# Patient Record
Sex: Female | Born: 1955
Health system: Southern US, Community
[De-identification: ages and names within clinical notes are randomized; demographics above are authoritative.]

## PROBLEM LIST (undated history)

## (undated) DIAGNOSIS — H5440 Blindness, one eye, unspecified eye: Secondary | ICD-10-CM

## (undated) DIAGNOSIS — N2889 Other specified disorders of kidney and ureter: Secondary | ICD-10-CM

## (undated) DIAGNOSIS — M1990 Unspecified osteoarthritis, unspecified site: Secondary | ICD-10-CM

## (undated) DIAGNOSIS — K219 Gastro-esophageal reflux disease without esophagitis: Secondary | ICD-10-CM

## (undated) DIAGNOSIS — K760 Fatty (change of) liver, not elsewhere classified: Secondary | ICD-10-CM

## (undated) HISTORY — PX: TONSILLECTOMY: SUR1361

## (undated) HISTORY — PX: TUBAL LIGATION: SHX77

---

## 1997-11-16 ENCOUNTER — Other Ambulatory Visit: Admission: RE | Admit: 1997-11-16 | Discharge: 1997-11-16 | Payer: Self-pay | Admitting: Obstetrics & Gynecology

## 1998-12-27 ENCOUNTER — Other Ambulatory Visit: Admission: RE | Admit: 1998-12-27 | Discharge: 1998-12-27 | Payer: Self-pay | Admitting: Obstetrics and Gynecology

## 1999-01-08 ENCOUNTER — Ambulatory Visit (HOSPITAL_COMMUNITY): Admission: RE | Admit: 1999-01-08 | Discharge: 1999-01-08 | Payer: Self-pay | Admitting: Obstetrics and Gynecology

## 1999-01-08 ENCOUNTER — Encounter: Payer: Self-pay | Admitting: Obstetrics and Gynecology

## 1999-01-18 ENCOUNTER — Other Ambulatory Visit: Admission: RE | Admit: 1999-01-18 | Discharge: 1999-01-18 | Payer: Self-pay | Admitting: Obstetrics and Gynecology

## 1999-01-18 ENCOUNTER — Encounter (INDEPENDENT_AMBULATORY_CARE_PROVIDER_SITE_OTHER): Payer: Self-pay

## 2000-02-20 ENCOUNTER — Encounter: Payer: Self-pay | Admitting: Obstetrics and Gynecology

## 2000-02-20 ENCOUNTER — Ambulatory Visit (HOSPITAL_COMMUNITY): Admission: RE | Admit: 2000-02-20 | Discharge: 2000-02-20 | Payer: Self-pay | Admitting: Obstetrics and Gynecology

## 2000-04-09 ENCOUNTER — Other Ambulatory Visit: Admission: RE | Admit: 2000-04-09 | Discharge: 2000-04-09 | Payer: Self-pay | Admitting: Obstetrics and Gynecology

## 2000-09-08 ENCOUNTER — Encounter: Payer: Self-pay | Admitting: Orthopedic Surgery

## 2000-09-08 ENCOUNTER — Ambulatory Visit (HOSPITAL_COMMUNITY): Admission: RE | Admit: 2000-09-08 | Discharge: 2000-09-08 | Payer: Self-pay | Admitting: Orthopedic Surgery

## 2001-05-13 ENCOUNTER — Other Ambulatory Visit: Admission: RE | Admit: 2001-05-13 | Discharge: 2001-05-13 | Payer: Self-pay | Admitting: Obstetrics and Gynecology

## 2002-05-24 ENCOUNTER — Other Ambulatory Visit: Admission: RE | Admit: 2002-05-24 | Discharge: 2002-05-24 | Payer: Self-pay | Admitting: Obstetrics and Gynecology

## 2002-06-02 ENCOUNTER — Ambulatory Visit (HOSPITAL_COMMUNITY): Admission: RE | Admit: 2002-06-02 | Discharge: 2002-06-02 | Payer: Self-pay | Admitting: Obstetrics and Gynecology

## 2002-06-02 ENCOUNTER — Encounter: Payer: Self-pay | Admitting: Obstetrics and Gynecology

## 2002-12-23 ENCOUNTER — Encounter: Payer: Self-pay | Admitting: Emergency Medicine

## 2002-12-23 ENCOUNTER — Emergency Department (HOSPITAL_COMMUNITY): Admission: AD | Admit: 2002-12-23 | Discharge: 2002-12-24 | Payer: Self-pay

## 2002-12-24 ENCOUNTER — Encounter: Payer: Self-pay | Admitting: Emergency Medicine

## 2003-05-26 ENCOUNTER — Other Ambulatory Visit: Admission: RE | Admit: 2003-05-26 | Discharge: 2003-05-26 | Payer: Self-pay | Admitting: Obstetrics and Gynecology

## 2003-06-05 ENCOUNTER — Ambulatory Visit (HOSPITAL_COMMUNITY): Admission: RE | Admit: 2003-06-05 | Discharge: 2003-06-05 | Payer: Self-pay | Admitting: Obstetrics and Gynecology

## 2004-08-02 ENCOUNTER — Ambulatory Visit (HOSPITAL_COMMUNITY): Admission: RE | Admit: 2004-08-02 | Discharge: 2004-08-02 | Payer: Self-pay | Admitting: Obstetrics and Gynecology

## 2004-08-09 ENCOUNTER — Other Ambulatory Visit: Admission: RE | Admit: 2004-08-09 | Discharge: 2004-08-09 | Payer: Self-pay | Admitting: Obstetrics and Gynecology

## 2006-09-23 ENCOUNTER — Ambulatory Visit (HOSPITAL_COMMUNITY): Admission: RE | Admit: 2006-09-23 | Discharge: 2006-09-23 | Payer: Self-pay | Admitting: Obstetrics and Gynecology

## 2010-02-21 ENCOUNTER — Ambulatory Visit (HOSPITAL_COMMUNITY): Admission: RE | Admit: 2010-02-21 | Discharge: 2010-02-21 | Payer: Self-pay | Admitting: Family Medicine

## 2010-06-05 ENCOUNTER — Emergency Department (HOSPITAL_COMMUNITY)
Admission: EM | Admit: 2010-06-05 | Discharge: 2010-06-05 | Disposition: A | Discharge status: Home / Self Care | Payer: Medicaid Other | Attending: Emergency Medicine | Admitting: Emergency Medicine

## 2010-06-05 DIAGNOSIS — R109 Unspecified abdominal pain: Secondary | ICD-10-CM | POA: Insufficient documentation

## 2010-06-05 DIAGNOSIS — R509 Fever, unspecified: Secondary | ICD-10-CM | POA: Insufficient documentation

## 2010-06-05 DIAGNOSIS — R197 Diarrhea, unspecified: Secondary | ICD-10-CM | POA: Insufficient documentation

## 2010-06-05 DIAGNOSIS — K5900 Constipation, unspecified: Secondary | ICD-10-CM | POA: Insufficient documentation

## 2010-06-05 DIAGNOSIS — K5289 Other specified noninfective gastroenteritis and colitis: Secondary | ICD-10-CM | POA: Insufficient documentation

## 2010-06-05 DIAGNOSIS — R630 Anorexia: Secondary | ICD-10-CM | POA: Insufficient documentation

## 2010-06-05 DIAGNOSIS — R112 Nausea with vomiting, unspecified: Secondary | ICD-10-CM | POA: Insufficient documentation

## 2010-06-05 LAB — URINALYSIS, ROUTINE W REFLEX MICROSCOPIC
Bilirubin Urine: NEGATIVE
Bilirubin Urine: NEGATIVE
Hgb urine dipstick: NEGATIVE
Ketones, ur: NEGATIVE mg/dL
Ketones, ur: NEGATIVE mg/dL
Nitrite: NEGATIVE
Nitrite: NEGATIVE
Protein, ur: NEGATIVE mg/dL
Protein, ur: NEGATIVE mg/dL
Specific Gravity, Urine: 1.02 (ref 1.005–1.030)
Specific Gravity, Urine: 1.007 (ref 1.005–1.030)
Urine Glucose, Fasting: NEGATIVE mg/dL
Urobilinogen, UA: 0.2 mg/dL (ref 0.0–1.0)
Urobilinogen, UA: 0.2 mg/dL (ref 0.0–1.0)
pH: 7.5 (ref 5.0–8.0)

## 2010-06-05 LAB — URINE MICROSCOPIC-ADD ON

## 2010-06-20 ENCOUNTER — Other Ambulatory Visit: Payer: Self-pay | Admitting: Gastroenterology

## 2010-06-20 DIAGNOSIS — R194 Change in bowel habit: Secondary | ICD-10-CM

## 2010-06-26 ENCOUNTER — Ambulatory Visit
Admission: RE | Admit: 2010-06-26 | Discharge: 2010-06-26 | Disposition: A | Discharge status: Home / Self Care | Payer: No Typology Code available for payment source | Source: Ambulatory Visit | Attending: Gastroenterology | Admitting: Gastroenterology

## 2010-06-26 DIAGNOSIS — R194 Change in bowel habit: Secondary | ICD-10-CM

## 2010-06-26 MED ORDER — IOHEXOL 300 MG/ML  SOLN
100.0000 mL | Freq: Once | INTRAMUSCULAR | Status: AC | PRN
  Administered 2010-06-26: 100 mL via INTRAVENOUS

## 2010-06-28 ENCOUNTER — Other Ambulatory Visit (HOSPITAL_COMMUNITY)
Admission: RE | Admit: 2010-06-28 | Discharge: 2010-06-28 | Disposition: A | Discharge status: Home / Self Care | Payer: Self-pay | Source: Ambulatory Visit | Attending: Gastroenterology | Admitting: Gastroenterology

## 2010-06-28 ENCOUNTER — Other Ambulatory Visit: Payer: Self-pay | Admitting: Gastroenterology

## 2010-06-28 DIAGNOSIS — B379 Candidiasis, unspecified: Secondary | ICD-10-CM | POA: Insufficient documentation

## 2011-01-30 ENCOUNTER — Other Ambulatory Visit (HOSPITAL_COMMUNITY): Payer: Self-pay | Admitting: Family Medicine

## 2011-01-30 DIAGNOSIS — Z1231 Encounter for screening mammogram for malignant neoplasm of breast: Secondary | ICD-10-CM

## 2011-02-24 ENCOUNTER — Ambulatory Visit (HOSPITAL_COMMUNITY)
Admission: RE | Admit: 2011-02-24 | Discharge: 2011-02-24 | Disposition: A | Discharge status: Home / Self Care | Payer: Medicaid Other | Source: Ambulatory Visit | Attending: Family Medicine | Admitting: Family Medicine

## 2011-02-24 DIAGNOSIS — Z1231 Encounter for screening mammogram for malignant neoplasm of breast: Secondary | ICD-10-CM | POA: Insufficient documentation

## 2012-03-15 ENCOUNTER — Other Ambulatory Visit (HOSPITAL_COMMUNITY): Payer: Self-pay | Admitting: Nurse Practitioner

## 2012-03-15 DIAGNOSIS — Z1231 Encounter for screening mammogram for malignant neoplasm of breast: Secondary | ICD-10-CM

## 2012-04-06 ENCOUNTER — Ambulatory Visit (HOSPITAL_COMMUNITY)
Admission: RE | Admit: 2012-04-06 | Discharge: 2012-04-06 | Disposition: A | Discharge status: Home / Self Care | Payer: Medicaid Other | Source: Ambulatory Visit | Attending: Nurse Practitioner | Admitting: Nurse Practitioner

## 2012-04-06 DIAGNOSIS — Z1231 Encounter for screening mammogram for malignant neoplasm of breast: Secondary | ICD-10-CM | POA: Insufficient documentation

## 2013-03-14 ENCOUNTER — Other Ambulatory Visit (HOSPITAL_COMMUNITY): Payer: Self-pay | Admitting: Nurse Practitioner

## 2013-03-14 DIAGNOSIS — Z1231 Encounter for screening mammogram for malignant neoplasm of breast: Secondary | ICD-10-CM

## 2013-03-25 ENCOUNTER — Telehealth (INDEPENDENT_AMBULATORY_CARE_PROVIDER_SITE_OTHER): Payer: Self-pay

## 2013-03-25 NOTE — Telephone Encounter (Signed)
No notes in this encounter. 

## 2013-04-08 ENCOUNTER — Ambulatory Visit (HOSPITAL_COMMUNITY)
Admission: RE | Admit: 2013-04-08 | Discharge: 2013-04-08 | Disposition: A | Discharge status: Home / Self Care | Payer: Medicaid Other | Source: Ambulatory Visit | Attending: Nurse Practitioner | Admitting: Nurse Practitioner

## 2013-04-08 DIAGNOSIS — Z1231 Encounter for screening mammogram for malignant neoplasm of breast: Secondary | ICD-10-CM | POA: Insufficient documentation

## 2014-02-28 ENCOUNTER — Other Ambulatory Visit: Payer: Self-pay | Admitting: *Deleted

## 2014-03-31 ENCOUNTER — Other Ambulatory Visit (HOSPITAL_COMMUNITY): Payer: Self-pay | Admitting: Nurse Practitioner

## 2014-03-31 DIAGNOSIS — Z1231 Encounter for screening mammogram for malignant neoplasm of breast: Secondary | ICD-10-CM

## 2014-04-11 ENCOUNTER — Ambulatory Visit (HOSPITAL_COMMUNITY)
Admission: RE | Admit: 2014-04-11 | Discharge: 2014-04-11 | Disposition: A | Discharge status: Home / Self Care | Payer: Medicaid Other | Source: Ambulatory Visit | Attending: Nurse Practitioner | Admitting: Nurse Practitioner

## 2014-04-11 DIAGNOSIS — R921 Mammographic calcification found on diagnostic imaging of breast: Secondary | ICD-10-CM | POA: Insufficient documentation

## 2014-04-11 DIAGNOSIS — Z1231 Encounter for screening mammogram for malignant neoplasm of breast: Secondary | ICD-10-CM | POA: Diagnosis not present

## 2014-04-12 ENCOUNTER — Other Ambulatory Visit: Payer: Self-pay | Admitting: Nurse Practitioner

## 2014-04-12 DIAGNOSIS — R928 Other abnormal and inconclusive findings on diagnostic imaging of breast: Secondary | ICD-10-CM

## 2014-04-26 ENCOUNTER — Ambulatory Visit
Admission: RE | Admit: 2014-04-26 | Discharge: 2014-04-26 | Disposition: A | Discharge status: Home / Self Care | Payer: Medicaid Other | Source: Ambulatory Visit | Attending: Nurse Practitioner | Admitting: Nurse Practitioner

## 2014-04-26 DIAGNOSIS — R928 Other abnormal and inconclusive findings on diagnostic imaging of breast: Secondary | ICD-10-CM

## 2014-09-25 ENCOUNTER — Other Ambulatory Visit: Payer: Self-pay | Admitting: Nurse Practitioner

## 2014-09-25 DIAGNOSIS — R921 Mammographic calcification found on diagnostic imaging of breast: Secondary | ICD-10-CM

## 2014-10-26 ENCOUNTER — Ambulatory Visit
Admission: RE | Admit: 2014-10-26 | Discharge: 2014-10-26 | Disposition: A | Discharge status: Home / Self Care | Payer: Medicaid Other | Source: Ambulatory Visit | Attending: Nurse Practitioner | Admitting: Nurse Practitioner

## 2014-10-26 ENCOUNTER — Other Ambulatory Visit: Payer: Self-pay | Admitting: Nurse Practitioner

## 2014-10-26 DIAGNOSIS — N6320 Unspecified lump in the left breast, unspecified quadrant: Secondary | ICD-10-CM

## 2014-10-26 DIAGNOSIS — R921 Mammographic calcification found on diagnostic imaging of breast: Secondary | ICD-10-CM

## 2015-02-26 ENCOUNTER — Other Ambulatory Visit (HOSPITAL_COMMUNITY): Payer: Self-pay | Admitting: Gastroenterology

## 2015-02-26 DIAGNOSIS — R1084 Generalized abdominal pain: Secondary | ICD-10-CM

## 2015-03-07 ENCOUNTER — Ambulatory Visit (HOSPITAL_COMMUNITY)
Admission: RE | Admit: 2015-03-07 | Discharge: 2015-03-07 | Disposition: A | Discharge status: Home / Self Care | Payer: Medicaid Other | Source: Ambulatory Visit | Attending: Gastroenterology | Admitting: Gastroenterology

## 2015-03-07 DIAGNOSIS — R1084 Generalized abdominal pain: Secondary | ICD-10-CM | POA: Insufficient documentation

## 2015-03-07 MED ORDER — SINCALIDE 5 MCG IJ SOLR: 0.0200 ug/kg | Freq: Once | INTRAMUSCULAR | Status: DC

## 2015-03-07 MED ORDER — TECHNETIUM TC 99M MEBROFENIN IV KIT
5.5000 | PACK | Freq: Once | INTRAVENOUS | Status: DC | PRN
  Administered 2015-03-07: 6 via INTRAVENOUS
  Filled 2015-03-07: qty 6

## 2015-03-14 ENCOUNTER — Other Ambulatory Visit: Payer: Self-pay | Admitting: Surgery

## 2015-03-20 ENCOUNTER — Other Ambulatory Visit: Payer: Self-pay | Admitting: Nurse Practitioner

## 2015-03-20 DIAGNOSIS — R921 Mammographic calcification found on diagnostic imaging of breast: Secondary | ICD-10-CM

## 2015-03-29 ENCOUNTER — Ambulatory Visit
Admission: RE | Admit: 2015-03-29 | Discharge: 2015-03-29 | Disposition: A | Discharge status: Home / Self Care | Payer: Medicaid Other | Source: Ambulatory Visit | Attending: Nurse Practitioner | Admitting: Nurse Practitioner

## 2015-03-29 DIAGNOSIS — R921 Mammographic calcification found on diagnostic imaging of breast: Secondary | ICD-10-CM

## 2015-03-29 NOTE — Pre-Procedure Instructions (Signed)
    Paula AlandDiane A Owens  03/29/2015     Your procedure is scheduled on Monday, December 12.  [facility] Ad[facility] at 5:30 A.M.               Your surgery is schedule for 7:30 a.m.   Call this number if you have problems the morning of surgery:[phone]                Remember:  Do not eat food or drink liquids after midnight Sunday, December 8.  Take these medicines the morning of surgery with A SIP OF WATER -- OMEPRAZOLE, EYE DROPS (STOP ASPIRIN, HERBAL MEDICINES, NO GOODY POWDERS/ B/C POWDERS)    Do not wear jewelry, make-up or nail polish.   Do not wear lotions, powders, or perfumes.     Do not shave 48 hours prior to surgery.     Do not bring valuables to the hospital.   [facility] is not responsible for any belongings or valuables.  Contacts, dentures or bridgework may not be worn into surgery.  Leave your suitcase in the car.  After surgery it may be brought to your room.  For patients admitted to the hospital, discharge time will be determined by your treatment team.  Patients discharged the day of surgery will not be allowed to drive home.   Name and phone number of your driver:  -  Special instructions:  -   Please read over the following fact sheets that you were given. Pain Booklet, Coughing and Deep Breathing and Surgical Site Infection Prevention

## 2015-03-30 ENCOUNTER — Encounter (HOSPITAL_COMMUNITY)
Admission: RE | Admit: 2015-03-30 | Discharge: 2015-03-30 | Disposition: A | Discharge status: Home / Self Care | Payer: Medicaid Other | Source: Ambulatory Visit | Attending: Surgery | Admitting: Surgery

## 2015-03-30 ENCOUNTER — Encounter (HOSPITAL_COMMUNITY): Payer: Self-pay

## 2015-03-30 DIAGNOSIS — Z01812 Encounter for preprocedural laboratory examination: Secondary | ICD-10-CM | POA: Diagnosis not present

## 2015-03-30 DIAGNOSIS — K8050 Calculus of bile duct without cholangitis or cholecystitis without obstruction: Secondary | ICD-10-CM | POA: Diagnosis not present

## 2015-03-30 HISTORY — DX: Fatty (change of) liver, not elsewhere classified: K76.0

## 2015-03-30 HISTORY — DX: Gastro-esophageal reflux disease without esophagitis: K21.9

## 2015-03-30 HISTORY — DX: Blindness, one eye, unspecified eye: H54.40

## 2015-03-30 HISTORY — DX: Unspecified osteoarthritis, unspecified site: M19.90

## 2015-03-30 HISTORY — DX: Other specified disorders of kidney and ureter: N28.89

## 2015-03-30 LAB — COMPREHENSIVE METABOLIC PANEL
ALBUMIN: 4.2 g/dL (ref 3.5–5.0)
ALK PHOS: 83 U/L (ref 38–126)
ALT: 26 U/L (ref 14–54)
AST: 20 U/L (ref 15–41)
Anion gap: 7 (ref 5–15)
BILIRUBIN TOTAL: 1 mg/dL (ref 0.3–1.2)
BUN: 10 mg/dL (ref 6–20)
CO2: 28 mmol/L (ref 22–32)
Calcium: 10 mg/dL (ref 8.9–10.3)
Chloride: 106 mmol/L (ref 101–111)
Creatinine, Ser: 0.81 mg/dL (ref 0.44–1.00)
GFR calc Af Amer: >60 mL/min (ref >60)
GFR calc non Af Amer: >60 mL/min (ref >60)
GLUCOSE: 96 mg/dL (ref 65–99)
POTASSIUM: 4.3 mmol/L (ref 3.5–5.1)
Sodium: 141 mmol/L (ref 135–145)
TOTAL PROTEIN: 6.4 g/dL — AB (ref 6.5–8.1)

## 2015-03-30 LAB — CBC WITH DIFFERENTIAL/PLATELET
BASOS ABS: 0 10*3/uL (ref 0.0–0.1)
BASOS PCT: 1 %
Eosinophils Absolute: 0.2 10*3/uL (ref 0.0–0.7)
Eosinophils Relative: 3 %
HEMATOCRIT: 43.3 % (ref 36.0–46.0)
HEMOGLOBIN: 14.5 g/dL (ref 12.0–15.0)
Lymphocytes Relative: 38 %
Lymphs Abs: 2.4 10*3/uL (ref 0.7–4.0)
MCH: 30.9 pg (ref 26.0–34.0)
MCHC: 33.5 g/dL (ref 30.0–36.0)
MCV: 92.3 fL (ref 78.0–100.0)
Monocytes Absolute: 0.4 10*3/uL (ref 0.1–1.0)
Monocytes Relative: 7 %
NEUTROS ABS: 3.2 10*3/uL (ref 1.7–7.7)
NEUTROS PCT: 51 %
Platelets: 243 10*3/uL (ref 150–400)
RBC: 4.69 MIL/uL (ref 3.87–5.11)
RDW: 12 % (ref 11.5–15.5)
WBC: 6.2 10*3/uL (ref 4.0–10.5)

## 2015-03-30 LAB — PROTIME-INR
INR: 1.06 (ref 0.00–1.49)
Prothrombin Time: 14 seconds (ref 11.6–15.2)

## 2015-04-01 MED ORDER — CEFAZOLIN SODIUM-DEXTROSE 2-3 GM-% IV SOLR
2.0000 g | INTRAVENOUS | Status: AC
  Administered 2015-04-02: 2 g via INTRAVENOUS
  Filled 2015-04-01: qty 50

## 2015-04-01 NOTE — H&P (Signed)
Paula [place]  [facility] [facility] Patient #: [id number] DOB: [birth year] Married / Language: English / Race: White Female  History of Present Illness   Patient words: gallbladder.   The patient is a 59 year old female who presents with a complaint of possible gall bladder disease.   Her PCP is Dr. Herb Graysammy Owens.  GI physician - Dr. Roderic ScarceV. Owens.  She comes by herself. She is going to call her husband to pick her up.   Paula Owens has RUQ pain that radiates arouind to her back for the past 2 months or so. The pain does seem related to certain foods she eats. She does avoid fatty foods which she says makes her pain worse. She has had no fever, jaundice, or change in bowels. She has no history of liver, pancreas, or colon disease. She's had no prior abdominal Owens. She had an abdominal [facility] 23 January 2015 that showed a fatty liver and a left lower pole angiomyolipoma. She had a HIDA scan on 07 March 2015 that showed a 39% ejection fraction. She has seen Dr. Doy MinceVince Owens for postprandial right upper quadrant pain that radiates across her abdomen. Her lsat EGD and colonoscopy were 06/2010.  I gave copies of the US and HIDA scan to the patient.   I discussed with the patient the indications and risks of gall bladder Owens. The primary risks of gall bladder Owens include, but are not limited to, bleeding, infection, common bile duct injury, and open Owens. There is also the risk that the patient may have continued symptoms after Owens. We discussed the typical post-operative recovery course. I tried to answer the patient's questions.  I gave the patient literature about gall bladder Owens.  I told her that with a normal US and essentially normal HIDA, the chance for improvement in her symptoms probably runs 50 to 60%. She does have symptoms directly realted to eating.  Past Medical History: 1. 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 HTN 3. GERD 4. She is on disability because of her loss of vision. 5. She has some low back pain  Social History:  Married. On disability for loss of vision Has two children: 6427 and 59 yo, both live at home.   Other Problems Paula Owens; 03/14/2015 10:43 AM) Arthritis Back Pain Gastroesophageal Reflux Disease Heart murmur Hemorrhoids High blood pressure Hypercholesterolemia  Past Surgical History Paula Owens; 03/14/2015 10:43 AM) Colon Polyp Removal - Colonoscopy Oral Owens Tonsillectomy  Diagnostic Studies History Paula Owens; 03/14/2015 10:43 AM) Colonoscopy 1-5 years ago Mammogram within last year Pap Smear 1-5 years ago  Allergies Paula Owens; 03/14/2015 10:45 AM) Sulfa Antibiotics Doxycycline *DERMATOLOGICALS* Penicillin G Potassium *PENICILLINS* Erythromycin *MACROLIDES*  Medication History (Paula Owens, Owens; 03/14/2015 10:46 AM) Atorvastatin Calcium (20MG  Tablet, Oral) Active. Lisinopril (10MG  Tablet, Oral) Active. Omeprazole (40MG  Capsule DR, Oral) Active. Medications Reconciled  Social History Paula Owens; 03/14/2015 10:43 AM) Caffeine use Carbonated beverages, Tea. No alcohol use No drug use Tobacco use Never smoker.  Family History Paula Owens; 03/14/2015 10:43 AM) Alcohol Abuse Father. Arthritis Brother, Mother. Bleeding disorder Family Members In General. Breast Cancer Family Members In General. Heart Disease Mother. Respiratory Condition Family Members In General. Thyroid problems Family Members In General.  Pregnancy / Birth History Paula Owens; 03/14/2015 10:43 AM) Age  at menarche 13 years. Age of menopause [age] Contraceptive History Oral contraceptives. Gravida 3 Irregular  periods Maternal age 38-35 Para 2    Review of Systems Paula Owens; 03/14/2015 10:43 AM) General Present- Appetite Loss, Chills, Fatigue, Night Sweats and Weight Loss. Not Present- Fever and Weight Gain. Skin Not Present- Change in Wart/Mole, Dryness, Hives, Jaundice, New Lesions, Non-Healing Wounds, Rash and Ulcer. HEENT Present- Seasonal Allergies, Visual Disturbances and Wears glasses/contact lenses. Not Present- Earache, Hearing Loss, Hoarseness, Nose Bleed, Oral Ulcers, Ringing in the Ears, Sinus Pain, Sore Throat and Yellow Eyes. Respiratory Not Present- Bloody sputum, Chronic Cough, Difficulty Breathing, Snoring and Wheezing. Breast Not Present- Breast Mass, Breast Pain, Nipple Discharge and Skin Changes. Cardiovascular Not Present- Chest Pain, Difficulty Breathing Lying Down, Leg Cramps, Palpitations, Rapid Heart Rate, Shortness of Breath and Swelling of Extremities. Gastrointestinal Present- Abdominal Pain, Bloating, Gets full quickly at meals, Indigestion and Nausea. Not Present- Bloody Stool, Change in Bowel Habits, Chronic diarrhea, Constipation, Difficulty Swallowing, Excessive gas, Hemorrhoids, Rectal Pain and Vomiting. Female Genitourinary Not Present- Frequency, Nocturia, Painful Urination, Pelvic Pain and Urgency. Musculoskeletal Present- Joint Pain and Joint Stiffness. Not Present- Back Pain, Muscle Pain, Muscle Weakness and Swelling of Extremities. Neurological Present- Headaches. Not Present- Decreased Memory, Fainting, Numbness, Seizures, Tingling, Tremor, Trouble walking and Weakness. Psychiatric Present- Anxiety. Not Present- Bipolar, Change in Sleep Pattern, Depression, Fearful and Frequent crying. Endocrine Present- Cold Intolerance, Heat Intolerance and Hot flashes. Not Present- Excessive Hunger, Hair Changes and New Diabetes. Hematology Present- Easy Bruising. Not Present- Excessive bleeding, Gland problems, HIV and Persistent Infections.  Vitals (Paula Owens  Owens; 03/14/2015 10:44 AM) 03/14/2015 10:44 AM Weight: 175 lb Height: 61in Body Surface Area: 1.78 m Body Mass Index: 33.07 kg/m  Temp.: 98.74F(Temporal)  Pulse: 68 (Regular)  BP: 130/80 (Sitting, Left Arm, Standard)   Physical Exam  General: alert and generally healthy appearing. HEENT: Normal. Pupils equal.  Neck: Supple. No mass. No thyroid mass. Lymph Nodes: No supraclavicular or cervical nodes.  Lungs: Clear to auscultation and symmetric breath sounds. Heart: RRR. No murmur or rub.  Abdomen: Soft. No mass. No hernia. Normal bowel sounds. No abdominal scars.   She points to the right upper quadrant as her pain - which radiates around to her back. Rectal: Not done.  Extremities: Good strength and ROM in upper and lower extremities.  Neurologic: Grossly intact to motor and sensory function. Psychiatric: Has normal mood and affect. Behavior is normal.  Assessment & Plan: 1.  RIGHT UPPER QUADRANT ABDOMINAL PAIN (R10.11)  Impression: Probable biliary colic.  2.  BILIARY COLIC (K80.50)  Impression: I discussed gall bladder Owens with the patient.  Plan: Lap chole with IOC  3. Legally blind in left eye, threatened right eye.  Secondary to autoimmune disease (There is mention of retinitis pigmentosa in her records, but she said that she does not have that) 4. HTN 5. GERD 6. She is on disability because of her loss of vision. 7. She has some low back pain  Ovidio Kin, MD, [facility] Pager: [phone] Office phone:  [phone]

## 2015-04-02 ENCOUNTER — Ambulatory Visit (HOSPITAL_COMMUNITY): Payer: Medicaid Other | Admitting: Anesthesiology

## 2015-04-02 ENCOUNTER — Observation Stay (HOSPITAL_COMMUNITY): Payer: Medicaid Other

## 2015-04-02 ENCOUNTER — Encounter (HOSPITAL_COMMUNITY)
Admission: RE | Disposition: A | Discharge status: Home / Self Care | Payer: Self-pay | Source: Ambulatory Visit | Attending: Surgery

## 2015-04-02 ENCOUNTER — Encounter (HOSPITAL_COMMUNITY): Payer: Self-pay | Admitting: Surgery

## 2015-04-02 ENCOUNTER — Observation Stay (HOSPITAL_COMMUNITY)
Admission: RE | Admit: 2015-04-02 | Discharge: 2015-04-03 | Disposition: A | Discharge status: Home / Self Care | Payer: Medicaid Other | Source: Ambulatory Visit | Attending: Surgery | Admitting: Surgery

## 2015-04-02 DIAGNOSIS — R922 Inconclusive mammogram: Secondary | ICD-10-CM | POA: Diagnosis not present

## 2015-04-02 DIAGNOSIS — Z880 Allergy status to penicillin: Secondary | ICD-10-CM | POA: Insufficient documentation

## 2015-04-02 DIAGNOSIS — K760 Fatty (change of) liver, not elsewhere classified: Secondary | ICD-10-CM | POA: Diagnosis not present

## 2015-04-02 DIAGNOSIS — Z23 Encounter for immunization: Secondary | ICD-10-CM | POA: Diagnosis not present

## 2015-04-02 DIAGNOSIS — Z91040 Latex allergy status: Secondary | ICD-10-CM | POA: Insufficient documentation

## 2015-04-02 DIAGNOSIS — M1990 Unspecified osteoarthritis, unspecified site: Secondary | ICD-10-CM | POA: Diagnosis not present

## 2015-04-02 DIAGNOSIS — I10 Essential (primary) hypertension: Secondary | ICD-10-CM | POA: Insufficient documentation

## 2015-04-02 DIAGNOSIS — H3552 Pigmentary retinal dystrophy: Secondary | ICD-10-CM | POA: Insufficient documentation

## 2015-04-02 DIAGNOSIS — Z419 Encounter for procedure for purposes other than remedying health state, unspecified: Secondary | ICD-10-CM

## 2015-04-02 DIAGNOSIS — K219 Gastro-esophageal reflux disease without esophagitis: Secondary | ICD-10-CM | POA: Insufficient documentation

## 2015-04-02 DIAGNOSIS — M549 Dorsalgia, unspecified: Secondary | ICD-10-CM | POA: Diagnosis not present

## 2015-04-02 DIAGNOSIS — Z888 Allergy status to other drugs, medicaments and biological substances status: Secondary | ICD-10-CM | POA: Diagnosis not present

## 2015-04-02 DIAGNOSIS — Z881 Allergy status to other antibiotic agents status: Secondary | ICD-10-CM | POA: Diagnosis not present

## 2015-04-02 DIAGNOSIS — K811 Chronic cholecystitis: Principal | ICD-10-CM | POA: Insufficient documentation

## 2015-04-02 DIAGNOSIS — K8050 Calculus of bile duct without cholangitis or cholecystitis without obstruction: Secondary | ICD-10-CM

## 2015-04-02 DIAGNOSIS — R1011 Right upper quadrant pain: Secondary | ICD-10-CM | POA: Diagnosis present

## 2015-04-02 DIAGNOSIS — E7800 Pure hypercholesterolemia, unspecified: Secondary | ICD-10-CM | POA: Insufficient documentation

## 2015-04-02 DIAGNOSIS — Z882 Allergy status to sulfonamides status: Secondary | ICD-10-CM | POA: Diagnosis not present

## 2015-04-02 DIAGNOSIS — H5441 Blindness, right eye, normal vision left eye: Secondary | ICD-10-CM | POA: Insufficient documentation

## 2015-04-02 DIAGNOSIS — K829 Disease of gallbladder, unspecified: Secondary | ICD-10-CM | POA: Diagnosis present

## 2015-04-02 HISTORY — PX: CHOLECYSTECTOMY: SHX55

## 2015-04-02 SURGERY — LAPAROSCOPIC CHOLECYSTECTOMY WITH INTRAOPERATIVE CHOLANGIOGRAM
Anesthesia: General | Site: Abdomen

## 2015-04-02 MED ORDER — ONDANSETRON 4 MG PO TBDP: 4.0000 mg | ORAL_TABLET | Freq: Four times a day (QID) | ORAL | Status: DC | PRN

## 2015-04-02 MED ORDER — BUPIVACAINE HCL (PF) 0.25 % IJ SOLN
INTRAMUSCULAR | Status: AC
  Filled 2015-04-02: qty 30

## 2015-04-02 MED ORDER — ONDANSETRON HCL 4 MG/2ML IJ SOLN: 4.0000 mg | Freq: Four times a day (QID) | INTRAMUSCULAR | Status: DC | PRN

## 2015-04-02 MED ORDER — PROMETHAZINE HCL 25 MG/ML IJ SOLN
INTRAMUSCULAR | Status: AC
  Filled 2015-04-02: qty 1

## 2015-04-02 MED ORDER — CARBOXYMETHYLCELLULOSE SODIUM 0.5 % OP SOLN: 1.0000 [drp] | Freq: Three times a day (TID) | OPHTHALMIC | Status: DC | PRN

## 2015-04-02 MED ORDER — HYDROMORPHONE HCL 1 MG/ML IJ SOLN
0.2500 mg | INTRAMUSCULAR | Status: DC | PRN
  Administered 2015-04-02 (×2): 0.25 mg via INTRAVENOUS

## 2015-04-02 MED ORDER — DIPHENHYDRAMINE HCL 50 MG/ML IJ SOLN: 25.0000 mg | Freq: Four times a day (QID) | INTRAMUSCULAR | Status: DC | PRN

## 2015-04-02 MED ORDER — LIDOCAINE HCL (CARDIAC) 20 MG/ML IV SOLN
INTRAVENOUS | Status: AC
  Filled 2015-04-02: qty 5

## 2015-04-02 MED ORDER — PANTOPRAZOLE SODIUM 40 MG PO TBEC
40.0000 mg | DELAYED_RELEASE_TABLET | Freq: Every day | ORAL | Status: DC
  Administered 2015-04-03: 40 mg via ORAL
  Filled 2015-04-02: qty 1

## 2015-04-02 MED ORDER — 0.9 % SODIUM CHLORIDE (POUR BTL) OPTIME
TOPICAL | Status: DC | PRN
  Administered 2015-04-02: 1000 mL

## 2015-04-02 MED ORDER — CHLORHEXIDINE GLUCONATE 4 % EX LIQD: 1.0000 "application " | Freq: Once | CUTANEOUS | Status: DC

## 2015-04-02 MED ORDER — MORPHINE SULFATE (PF) 2 MG/ML IV SOLN
1.0000 mg | INTRAVENOUS | Status: DC | PRN
  Administered 2015-04-02 (×2): 2 mg via INTRAVENOUS
  Filled 2015-04-02 (×2): qty 1

## 2015-04-02 MED ORDER — MIDAZOLAM HCL 2 MG/2ML IJ SOLN
INTRAMUSCULAR | Status: AC
  Filled 2015-04-02: qty 2

## 2015-04-02 MED ORDER — PROMETHAZINE HCL 25 MG/ML IJ SOLN
6.2500 mg | INTRAMUSCULAR | Status: DC | PRN
  Administered 2015-04-02: 6.25 mg via INTRAVENOUS

## 2015-04-02 MED ORDER — SODIUM CHLORIDE 0.9 % IJ SOLN
INTRAMUSCULAR | Status: AC
  Filled 2015-04-02: qty 10

## 2015-04-02 MED ORDER — ROCURONIUM BROMIDE 50 MG/5ML IV SOLN
INTRAVENOUS | Status: AC
  Filled 2015-04-02: qty 1

## 2015-04-02 MED ORDER — SUGAMMADEX SODIUM 200 MG/2ML IV SOLN
INTRAVENOUS | Status: AC
  Filled 2015-04-02: qty 2

## 2015-04-02 MED ORDER — HYDROCODONE-ACETAMINOPHEN 5-325 MG PO TABS
1.0000 | ORAL_TABLET | ORAL | Status: DC | PRN
  Administered 2015-04-02 – 2015-04-03 (×2): 2 via ORAL
  Filled 2015-04-02 (×2): qty 2

## 2015-04-02 MED ORDER — IBUPROFEN 600 MG PO TABS: 600.0000 mg | ORAL_TABLET | Freq: Four times a day (QID) | ORAL | Status: DC | PRN

## 2015-04-02 MED ORDER — PHENYLEPHRINE 40 MCG/ML (10ML) SYRINGE FOR IV PUSH (FOR BLOOD PRESSURE SUPPORT)
PREFILLED_SYRINGE | INTRAVENOUS | Status: AC
  Filled 2015-04-02: qty 10

## 2015-04-02 MED ORDER — ONDANSETRON HCL 4 MG/2ML IJ SOLN
INTRAMUSCULAR | Status: AC
  Filled 2015-04-02: qty 2

## 2015-04-02 MED ORDER — SODIUM CHLORIDE 0.9 % IV SOLN
INTRAVENOUS | Status: DC | PRN
  Administered 2015-04-02: 11 mL
  Administered 2015-04-02 (×2)

## 2015-04-02 MED ORDER — EPHEDRINE SULFATE 50 MG/ML IJ SOLN
INTRAMUSCULAR | Status: AC
  Filled 2015-04-02: qty 1

## 2015-04-02 MED ORDER — DEXAMETHASONE SODIUM PHOSPHATE 10 MG/ML IJ SOLN
INTRAMUSCULAR | Status: DC | PRN
  Administered 2015-04-02: 10 mg via INTRAVENOUS

## 2015-04-02 MED ORDER — FENTANYL CITRATE (PF) 100 MCG/2ML IJ SOLN
INTRAMUSCULAR | Status: DC | PRN
  Administered 2015-04-02: 100 ug via INTRAVENOUS
  Administered 2015-04-02 (×2): 50 ug via INTRAVENOUS

## 2015-04-02 MED ORDER — DIPHENHYDRAMINE HCL 25 MG PO CAPS: 25.0000 mg | ORAL_CAPSULE | Freq: Four times a day (QID) | ORAL | Status: DC | PRN

## 2015-04-02 MED ORDER — SUCCINYLCHOLINE CHLORIDE 20 MG/ML IJ SOLN
INTRAMUSCULAR | Status: AC
  Filled 2015-04-02: qty 1

## 2015-04-02 MED ORDER — SODIUM CHLORIDE 0.9 % IR SOLN
Status: DC | PRN
  Administered 2015-04-02: 1000 mL

## 2015-04-02 MED ORDER — LIDOCAINE HCL (CARDIAC) 20 MG/ML IV SOLN
INTRAVENOUS | Status: DC | PRN
  Administered 2015-04-02: 80 mg via INTRAVENOUS

## 2015-04-02 MED ORDER — HEPARIN SODIUM (PORCINE) 5000 UNIT/ML IJ SOLN
5000.0000 [IU] | Freq: Three times a day (TID) | INTRAMUSCULAR | Status: DC
  Administered 2015-04-02 – 2015-04-03 (×3): 5000 [IU] via SUBCUTANEOUS
  Filled 2015-04-02 (×3): qty 1

## 2015-04-02 MED ORDER — KETOROLAC TROMETHAMINE 30 MG/ML IJ SOLN
INTRAMUSCULAR | Status: AC
  Filled 2015-04-02: qty 1

## 2015-04-02 MED ORDER — INFLUENZA VAC SPLIT QUAD 0.5 ML IM SUSY
0.5000 mL | PREFILLED_SYRINGE | INTRAMUSCULAR | Status: AC
Start: 2015-04-03 — End: 2015-04-03
  Administered 2015-04-03: 0.5 mL via INTRAMUSCULAR
  Filled 2015-04-02: qty 0.5

## 2015-04-02 MED ORDER — MIDAZOLAM HCL 5 MG/5ML IJ SOLN
INTRAMUSCULAR | Status: DC | PRN
  Administered 2015-04-02: 2 mg via INTRAVENOUS

## 2015-04-02 MED ORDER — LACTATED RINGERS IV SOLN
INTRAVENOUS | Status: DC | PRN
  Administered 2015-04-02 (×2): via INTRAVENOUS

## 2015-04-02 MED ORDER — POLYVINYL ALCOHOL 1.4 % OP SOLN
1.0000 [drp] | Freq: Three times a day (TID) | OPHTHALMIC | Status: DC | PRN
  Filled 2015-04-02: qty 15

## 2015-04-02 MED ORDER — ONDANSETRON HCL 4 MG/2ML IJ SOLN
INTRAMUSCULAR | Status: DC | PRN
  Administered 2015-04-02: 4 mg via INTRAVENOUS

## 2015-04-02 MED ORDER — KETOROLAC TROMETHAMINE 30 MG/ML IJ SOLN
30.0000 mg | Freq: Once | INTRAMUSCULAR | Status: AC
  Administered 2015-04-02: 30 mg via INTRAVENOUS

## 2015-04-02 MED ORDER — FENTANYL CITRATE (PF) 250 MCG/5ML IJ SOLN
INTRAMUSCULAR | Status: AC
  Filled 2015-04-02: qty 5

## 2015-04-02 MED ORDER — PROPOFOL 10 MG/ML IV BOLUS
INTRAVENOUS | Status: DC | PRN
  Administered 2015-04-02: 150 mg via INTRAVENOUS

## 2015-04-02 MED ORDER — ROCURONIUM BROMIDE 100 MG/10ML IV SOLN
INTRAVENOUS | Status: DC | PRN
  Administered 2015-04-02: 40 mg via INTRAVENOUS

## 2015-04-02 MED ORDER — HYDROMORPHONE HCL 1 MG/ML IJ SOLN
INTRAMUSCULAR | Status: AC
  Filled 2015-04-02: qty 1

## 2015-04-02 MED ORDER — BUPIVACAINE-EPINEPHRINE (PF) 0.25% -1:200000 IJ SOLN
INTRAMUSCULAR | Status: AC
  Filled 2015-04-02: qty 30

## 2015-04-02 MED ORDER — BUPIVACAINE HCL 0.25 % IJ SOLN
INTRAMUSCULAR | Status: DC | PRN
  Administered 2015-04-02: 30 mL

## 2015-04-02 MED ORDER — PROPOFOL 10 MG/ML IV BOLUS
INTRAVENOUS | Status: AC
  Filled 2015-04-02: qty 20

## 2015-04-02 MED ORDER — SUGAMMADEX SODIUM 200 MG/2ML IV SOLN
INTRAVENOUS | Status: DC | PRN
  Administered 2015-04-02: 200 mg via INTRAVENOUS

## 2015-04-02 MED ORDER — KCL IN DEXTROSE-NACL 20-5-0.45 MEQ/L-%-% IV SOLN
INTRAVENOUS | Status: DC
  Administered 2015-04-02 – 2015-04-03 (×2): via INTRAVENOUS
  Filled 2015-04-02 (×2): qty 1000

## 2015-04-02 SURGICAL SUPPLY — 50 items
APPLIER CLIP 5 13 M/L LIGAMAX5 (MISCELLANEOUS) ×2
APPLIER CLIP ROT 10 11.4 M/L (STAPLE)
APR CLP MED LRG 11.4X10 (STAPLE)
APR CLP MED LRG 5 ANG JAW (MISCELLANEOUS) ×1
BAG SPEC RTRVL LRG 6X4 10 (ENDOMECHANICALS) ×1
BLADE SURG ROTATE 9660 (MISCELLANEOUS) IMPLANT
CANISTER SUCTION 2500CC (MISCELLANEOUS) ×2 IMPLANT
CHLORAPREP W/TINT 26ML (MISCELLANEOUS) ×2 IMPLANT
CHOLANGIOGRAM CATH TAUT (CATHETERS) ×2 IMPLANT
CLIP APPLIE 5 13 M/L LIGAMAX5 (MISCELLANEOUS) IMPLANT
CLIP APPLIE ROT 10 11.4 M/L (STAPLE) IMPLANT
COVER MAYO STAND STRL (DRAPES) ×2 IMPLANT
COVER SURGICAL LIGHT HANDLE (MISCELLANEOUS) ×2 IMPLANT
DRAPE C-ARM 42X72 X-RAY (DRAPES) ×2 IMPLANT
DRAPE LAPAROSCOPIC ABDOMINAL (DRAPES) ×1 IMPLANT
ELECT REM PT RETURN 9FT ADLT (ELECTROSURGICAL) ×2
ELECTRODE REM PT RTRN 9FT ADLT (ELECTROSURGICAL) ×1 IMPLANT
FILTER SMOKE EVAC LAPAROSHD (FILTER) ×2 IMPLANT
GLOVE BIOGEL PI IND STRL 7.0 (GLOVE) IMPLANT
GLOVE BIOGEL PI INDICATOR 7.0 (GLOVE) ×1
GLOVE SURG SIGNA 7.5 PF LTX (GLOVE) ×2 IMPLANT
GLOVE SURG SS PI 7.0 STRL IVOR (GLOVE) ×1 IMPLANT
GLOVE SURG SS PI 7.5 STRL IVOR (GLOVE) ×2 IMPLANT
GOWN STRL REUS W/ TWL LRG LVL3 (GOWN DISPOSABLE) ×2 IMPLANT
GOWN STRL REUS W/ TWL XL LVL3 (GOWN DISPOSABLE) ×1 IMPLANT
GOWN STRL REUS W/TWL LRG LVL3 (GOWN DISPOSABLE) ×4
GOWN STRL REUS W/TWL XL LVL3 (GOWN DISPOSABLE) ×2
GOWN STRL REUS W/TWL XL LVL4 (GOWN DISPOSABLE) ×1 IMPLANT
IV CATH 14GX2 1/4 (CATHETERS) ×2 IMPLANT
KIT BASIN OR (CUSTOM PROCEDURE TRAY) ×2 IMPLANT
KIT ROOM TURNOVER OR (KITS) ×2 IMPLANT
LIQUID BAND (GAUZE/BANDAGES/DRESSINGS) ×2 IMPLANT
NS IRRIG 1000ML POUR BTL (IV SOLUTION) ×2 IMPLANT
PAD ARMBOARD 7.5X6 YLW CONV (MISCELLANEOUS) ×2 IMPLANT
PENCIL BUTTON HOLSTER BLD 10FT (ELECTRODE) ×1 IMPLANT
POUCH SPECIMEN RETRIEVAL 10MM (ENDOMECHANICALS) ×2 IMPLANT
SCISSORS LAP 5X35 DISP (ENDOMECHANICALS) ×2 IMPLANT
SET IRRIG TUBING LAPAROSCOPIC (IRRIGATION / IRRIGATOR) ×2 IMPLANT
SLEEVE ENDOPATH XCEL 5M (ENDOMECHANICALS) ×3 IMPLANT
SPECIMEN JAR SMALL (MISCELLANEOUS) ×2 IMPLANT
STOPCOCK 4 WAY LG BORE MALE ST (IV SETS) ×2 IMPLANT
SUT MON AB 5-0 PS2 18 (SUTURE) ×2 IMPLANT
TOWEL OR 17X24 6PK STRL BLUE (TOWEL DISPOSABLE) ×2 IMPLANT
TOWEL OR 17X26 10 PK STRL BLUE (TOWEL DISPOSABLE) ×2 IMPLANT
TRAY LAPAROSCOPIC MC (CUSTOM PROCEDURE TRAY) ×2 IMPLANT
TROCAR XCEL BLUNT TIP 100MML (ENDOMECHANICALS) ×2 IMPLANT
TROCAR XCEL NON-BLD 11X100MML (ENDOMECHANICALS) IMPLANT
TROCAR XCEL NON-BLD 5MMX100MML (ENDOMECHANICALS) ×2 IMPLANT
TUBING EXTENTION W/L.L. (IV SETS) ×2 IMPLANT
TUBING INSUFFLATION (TUBING) ×2 IMPLANT

## 2015-04-02 NOTE — Progress Notes (Signed)
Report given to lauren rn as caregiver 

## 2015-04-02 NOTE — Progress Notes (Signed)
Pt admitted to 6N05 via bed from PACU.  Pt AAO x4.  Pt on RA.  Pt has 20G to lt hand with fluids infusing.  Pt has four abdominal lap sites with skin glue.  SCDs in place.  Pt has no questions at the moment.  Report rcvd from Lauren.  Will continue to monitor.

## 2015-04-02 NOTE — Op Note (Signed)
04/02/2015  8:38 AM  PATIENT:  Paula Owens, 59 y.o., female, MRN: [id number]  PREOP DIAGNOSIS:  Biliary Colic  POSTOP DIAGNOSIS:   Chronic cholecystitis, biliary colic  PROCEDURE:   Procedure(s): LAPAROSCOPIC CHOLECYSTECTOMY WITH INTRAOPERATIVE CHOLANGIOGRAM  SURGEON:   Ovidio Kinavid Cassius Cullinane, M.D.  ASSISTANT:   A. Derrell Lollingamirez, MD  ANESTHESIA:   general  Anesthesiologist: Eilene GhaziGeorge Rose, MD CRNA: Rogelia Bogahomas P Mueller, CRNA  General  ASA: 2  EBL:  minimal  ml  BLOOD ADMINISTERED: none  DRAINS: none   LOCAL MEDICATIONS USED:   30 cc 1/4% marcaine  SPECIMEN:   Gall bladder  COUNTS CORRECT:  YES  INDICATIONS FOR PROCEDURE:  Paula Owens is a 59 y.o. (DOB: [birth year]) white  female whose primary care physician is Herb GraysSPEAR, TAMMY, MD and comes for cholecystectomy.   The indications and risks of the gall bladder surgery were explained to the patient.  The risks include, but are not limited to, infection, bleeding, common bile duct injury and open surgery.  Her symptoms are biliary in nature, so I think that she is having biliary colic, but she understands that the surgery may not fix her symptoms.  SURGERY:  The patient was taken to room [facility] at [facility].  The abdomen was prepped with chloroprep.  The patient was given 2 gm Ancef at the beginning of the operation.   A time out was held and the surgical checklist run.   An infraumbilical incision was made into the abdominal cavity.  A 12 mm Hasson trocar was inserted into the abdominal cavity through the infraumbilical incision and secured with a 0 Vicryl suture.  Three additional trocars were inserted: a 5 mm trocar in the sub-xiphoid location, a 5 mm trocar in the right mid subcostal area, and a 5 mm trocar in the right lateral subcostal area.   The abdomen was explored and the liver, stomach, and bowel that could be seen were unremarkable.   The gall bladder had adhesions along its anterior surface, consistent with chronic gall bladder  disease.  The gall bladder was identified and rotated cephalad.  Disssection was carried down to the gall bladder/cystic duct junction and the cystic duct isolated.  A clip was placed on the gall bladder side of the cystic duct.   An intra-operative cholangiogram was shot.   The intra-operative cholangiogram was shot using a cut off Taut catheter placed through a 14 gauge angiocath in the RUQ.  The Taut catheter was inserted in the cut cystic duct and secured with an endoclip.  A cholangiogram was shot with 10 cc of 1/2 strength Omnipaque.  Using fluoroscopy, the cholangiogram showed the flow of contrast into the common bile duct, up the hepatic radicals, and into the duodenum.  There was no mass or obstruction.  This was a normal intra-operative cholangiogram.   The Taut catheter was removed.  The cystic duct was tripley endoclipped and the cystic artery was identified and clipped.  The gall bladder was bluntly and sharpley dissected from the gall bladder bed.   After the gall bladder was removed from the liver, the gall bladder bed and Triangle of [place]ot were inspected.  There was no bleeding or bile leak.  The gall bladder was placed in a endocatch bag and delivered through the umbilicus.  The abdomen was irrigated with 500 cc saline.   The trocars were then removed.  I infiltrated 30 cc of 1/4% Marcaine into the incisions.  The umbilical port closed with a 0 Vicryl suture  and the skin closed with 5-0 Monocryl.  The skin was painted with LiquidBand.  The patient's sponge and needle count were correct.  The patient was transported to the RR in good condition.  Ovidio Kin, MD, [facility] Pager: [phone] Office phone:  [phone]

## 2015-04-02 NOTE — Anesthesia Postprocedure Evaluation (Signed)
Anesthesia Post Note  Patient: Paula Owens  Procedure(s) Performed: Procedure(s) (LRB): LAPAROSCOPIC CHOLECYSTECTOMY WITH INTRAOPERATIVE CHOLANGIOGRAM (N/A)  Patient location during evaluation: PACU Anesthesia Type: General Level of consciousness: awake and alert Pain management: pain level controlled Vital Signs Assessment: post-procedure vital signs reviewed and stable Respiratory status: spontaneous breathing and respiratory function stable Cardiovascular status: blood pressure returned to baseline and stable Postop Assessment: no signs of nausea or vomiting Anesthetic complications: no    Last Vitals:  Filed Vitals:   04/02/15 0915 04/02/15 0930  BP: 124/71 108/64  Pulse: 72   Temp:    Resp: 18     Last Pain:  Filed Vitals:   04/02/15 0933  PainSc: 5                  Bonne Whack S

## 2015-04-02 NOTE — Anesthesia Procedure Notes (Signed)
Procedure Name: Intubation Date/Time: 04/02/2015 7:35 AM Performed by: Rogelia BogaMUELLER, Almer Bushey P Pre-anesthesia Checklist: Patient identified, Emergency Drugs available, Suction available, Patient being monitored and Timeout performed Patient Re-evaluated:Patient Re-evaluated prior to inductionOxygen Delivery Method: Circle system utilized Preoxygenation: Pre-oxygenation with 100% oxygen Intubation Type: IV induction Ventilation: Mask ventilation without difficulty Laryngoscope Size: Mac and 4 Grade View: Grade II Tube type: Oral Tube size: 7.5 mm Number of attempts: 1 Airway Equipment and Method: Stylet Placement Confirmation: ETT inserted through vocal cords under direct vision,  positive ETCO2 and breath sounds checked- equal and bilateral Secured at: 21 cm Tube secured with: Tape Dental Injury: Teeth and Oropharynx as per pre-operative assessment

## 2015-04-02 NOTE — Anesthesia Preprocedure Evaluation (Addendum)
Anesthesia Evaluation  Patient identified by MRN, date of birth, ID band Patient awake    Reviewed: Allergy & Precautions, NPO status , Patient's Chart, lab work & pertinent test results  Airway Mallampati: II  TM Distance: >3 FB Neck ROM: Full    Dental no notable dental hx. (+) Teeth Intact, Dental Advisory Given   Pulmonary neg pulmonary ROS,    Pulmonary exam normal breath sounds clear to auscultation       Cardiovascular negative cardio ROS Normal cardiovascular exam Rhythm:Regular Rate:Normal     Neuro/Psych negative neurological ROS  negative psych ROS   GI/Hepatic Neg liver ROS, GERD  Medicated and Controlled,  Endo/Other  negative endocrine ROS  Renal/GU negative Renal ROS  negative genitourinary   Musculoskeletal negative musculoskeletal ROS (+) Arthritis , Osteoarthritis,    Abdominal   Peds negative pediatric ROS (+)  Hematology negative hematology ROS (+)   Anesthesia Other Findings   Reproductive/Obstetrics negative OB ROS                            Anesthesia Physical Anesthesia Plan  ASA: II  Anesthesia Plan: General   Post-op Pain Management:    Induction: Intravenous  Airway Management Planned: Oral ETT  Additional Equipment:   Intra-op Plan:   Post-operative Plan: Extubation in OR  Informed Consent: I have reviewed the patients History and Physical, chart, labs and discussed the procedure including the risks, benefits and alternatives for the proposed anesthesia with the patient or authorized representative who has indicated his/her understanding and acceptance.   Dental advisory given  Plan Discussed with: CRNA, Surgeon and Anesthesiologist  Anesthesia Plan Comments:        Anesthesia Quick Evaluation

## 2015-04-02 NOTE — Interval H&P Note (Signed)
History and Physical Interval Note:  04/02/2015 7:16 AM  Paula Owens  has presented today for surgery, with the diagnosis of Biliany Colic  The various methods of treatment have been discussed with the patient and family.  Daughter, [name], in room with patient.  After consideration of risks, benefits and other options for treatment, the patient has consented to  Procedure(s): LAPAROSCOPIC CHOLECYSTECTOMY WITH INTRAOPERATIVE CHOLANGIOGRAM (N/A) as a surgical intervention .  The patient's history has been reviewed, patient examined, no change in status, stable for surgery.  I have reviewed the patient's chart and labs.  Questions were answered to the patient's satisfaction.     Jailin Manocchio H

## 2015-04-02 NOTE — Transfer of Care (Signed)
Immediate Anesthesia Transfer of Care Note  Patient: Paula Owens  Procedure(s) Performed: Procedure(s): LAPAROSCOPIC CHOLECYSTECTOMY WITH INTRAOPERATIVE CHOLANGIOGRAM (N/A)  Patient Location: PACU  Anesthesia Type:General  Level of Consciousness: awake, alert , oriented and patient cooperative  Airway & Oxygen Therapy: Patient Spontanous Breathing and Patient connected to nasal cannula oxygen  Post-op Assessment: Report given to RN, Post -op Vital signs reviewed and stable and Patient moving all extremities X 4  Post vital signs: Reviewed and stable  Last Vitals:  Filed Vitals:   04/02/15 0628  BP: 144/78  Pulse: 71  Temp: 36.4 C  Resp: 20    Complications: No apparent anesthesia complications

## 2015-04-03 ENCOUNTER — Encounter (HOSPITAL_COMMUNITY): Payer: Self-pay | Admitting: Surgery

## 2015-04-03 DIAGNOSIS — K811 Chronic cholecystitis: Secondary | ICD-10-CM | POA: Diagnosis not present

## 2015-04-03 MED ORDER — HYDROCODONE-ACETAMINOPHEN 5-325 MG PO TABS: 1.0000 | ORAL_TABLET | Freq: Four times a day (QID) | ORAL | Status: DC | PRN

## 2015-04-03 NOTE — Discharge Instructions (Signed)
[facility] - DISCHARGE INSTRUCTIONS TO PATIENT  Activity:  Lifting - No lifting more than 15 pounds for 1 week, then no limit  Wound Care:   May shower starting tomorrow  Diet:  As tolerated  Follow up appointment:  Call Dr. Allene PyoNewman's office [facility] at [phone] for an appointment in 2 to 4 weeks.  Medications and dosages:  Resume your home medications.  You have a prescription for:  Vicodin  Call Dr. Ezzard StandingNewman or his office  [phone]) if you have:  Temperature greater than 100.4,  Persistent nausea and vomiting,  Severe uncontrolled pain,  Redness, tenderness, or signs of infection (pain, swelling, redness, odor or green/yellow discharge around the site),  Difficulty breathing, headache or visual disturbances,  Any other questions or concerns you may have after discharge.  In an emergency, call 911 or go to an Emergency Department at a nearby hospital.

## 2015-04-03 NOTE — Progress Notes (Signed)
AVS given. Understanding demonstrated. IV removed. Transportation arranged by patient. Flu vacc given prior to DC in R arm.

## 2015-04-03 NOTE — Discharge Summary (Signed)
Physician Discharge Summary  Patient ID:  Paula Owens  MRN: [id number]  DOB/AGE: [birth year] 59 y.o.  Admit date: 04/02/2015 Discharge date: 04/03/2015  Discharge Diagnoses:  1. RIGHT UPPER QUADRANT ABDOMINAL PAIN (R10.11)  2. BILIARY COLIC (K80.50) 3. 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 HTN 5. GERD 6. She is on disability because of her loss of vision. 7. She has some low back pain   Active Problems:   Gall bladder disease   Operation: Procedure(s): LAPAROSCOPIC CHOLECYSTECTOMY WITH INTRAOPERATIVE CHOLANGIOGRAM on 04/02/2015 - D. Ezzard Standing  Discharged Condition: good  Hospital Course: [name] is an 59 y.o. female whose primary care physician is ANDERSON,TERESA, FNP and who was admitted 04/02/2015 with a chief complaint of biliary colic.   She was brought to the operating room on 04/02/2015 and underwent  LAPAROSCOPIC CHOLECYSTECTOMY WITH INTRAOPERATIVE CHOLANGIOGRAM.  She is now one day post op.  She is doing well, taking po's, and is ready to go home.  The discharge instructions were reviewed with the patient.  Consults: None  Significant Diagnostic Studies: Results for orders placed or performed during the hospital encounter of 03/30/15  CBC WITH DIFFERENTIAL  Result Value Ref Range   WBC 6.2 4.0 - 10.5 K/uL   RBC 4.69 3.87 - 5.11 MIL/uL   Hemoglobin 14.5 12.0 - 15.0 g/dL   HCT 21.3 08.6 - 57.8 %   MCV 92.3 78.0 - 100.0 fL   MCH 30.9 26.0 - 34.0 pg   MCHC 33.5 30.0 - 36.0 g/dL   RDW 46.9 62.9 - 52.8 %   Platelets 243 150 - 400 K/uL   Neutrophils Relative % 51 %   Neutro Abs 3.2 1.7 - 7.7 K/uL   Lymphocytes Relative 38 %   Lymphs Abs 2.4 0.7 - 4.0 K/uL   Monocytes Relative 7 %   Monocytes Absolute 0.4 0.1 - 1.0 K/uL   Eosinophils Relative 3 %   Eosinophils Absolute 0.2 0.0 - 0.7 K/uL    Basophils Relative 1 %   Basophils Absolute 0.0 0.0 - 0.1 K/uL  Comprehensive metabolic panel  Result Value Ref Range   Sodium 141 135 - 145 mmol/L   Potassium 4.3 3.5 - 5.1 mmol/L   Chloride 106 101 - 111 mmol/L   CO2 28 22 - 32 mmol/L   Glucose, Bld 96 65 - 99 mg/dL   BUN 10 6 - 20 mg/dL   Creatinine, Ser 4.13 0.44 - 1.00 mg/dL   Calcium 24.4 8.9 - 01.0 mg/dL   Total Protein 6.4 (L) 6.5 - 8.1 g/dL   Albumin 4.2 3.5 - 5.0 g/dL   AST 20 15 - 41 U/L   ALT 26 14 - 54 U/L   Alkaline Phosphatase 83 38 - 126 U/L   Total Bilirubin 1.0 0.3 - 1.2 mg/dL   GFR calc non Af Amer >60 >60 mL/min   GFR calc Af Amer >60 >60 mL/min   Anion gap 7 5 - 15  Protime-INR  Result Value Ref Range   Prothrombin Time 14.0 11.6 - 15.2 seconds   INR 1.06 0.00 - 1.49    Dg Cholangiogram Operative  04/02/2015  CLINICAL DATA:  Laparoscopic cholecystectomy. Intraoperative cholangiogram. EXAM: INTRAOPERATIVE CHOLANGIOGRAM FLUOROSCOPY TIME:  10 seconds COMPARISON:  Nuclear medicine HIDA scan - 03/07/2015; CT abdomen pelvis - 06/26/2010 FINDINGS: Intraoperative cholangiographic images of the right upper abdominal  quadrant during laparoscopic cholecystectomy are provided for review. Surgical clips overlie the expected location of the gallbladder fossa. Contrast injection demonstrates selective cannulation of the central aspect of the cystic duct. There is passage of contrast through the central aspect of the cystic duct with filling of a non dilated common bile duct. There is passage of contrast though the CBD and into the descending portion of the duodenum. There is minimal reflux of injected contrast into the common hepatic duct and central aspect of the non dilated intrahepatic biliary system. There are no discrete filling defects within the opacified portions of the biliary system to suggest the presence of choledocholithiasis. IMPRESSION: No evidence of choledocholithiasis. Electronically Signed   By: Simonne Come M.D.    On: 04/02/2015 11:34   Nm Hepato W/eject Fract  03/07/2015  CLINICAL DATA:  Generalized abdominal pain for 1-2 months EXAM: NUCLEAR MEDICINE HEPATOBILIARY IMAGING WITH GALLBLADDER EF TECHNIQUE: Sequential images of the abdomen were obtained out to 60 minutes following intravenous administration of radiopharmaceutical. After slow intravenous infusion of 1.6 micrograms Cholecystokinin, gallbladder ejection fraction was determined. RADIOPHARMACEUTICALS:  5.5 mCi Tc-104m Choletec IV COMPARISON:  None FINDINGS: Normal tracer extraction from bloodstream indicating normal hepatocellular function. Normal excretion of tracer into biliary tree. Gallbladder visualized at 33 min. Small bowel visualized at 11 min. No hepatic retention of tracer. Subjectively normal emptying of tracer from gallbladder following CCK administration. Calculated gallbladder ejection fraction is 39%, low normal. Patient reported no symptoms following CCK administration. IMPRESSION: Patent biliary tree. Low normal gallbladder response to CCK stimulation with a calculated gallbladder ejection fraction of 39%. Electronically Signed   By: Ulyses Southward M.D.   On: 03/07/2015 10:52   Mm Digital Diagnostic Unilat R  03/29/2015  CLINICAL DATA:  Six-month follow-up for probably benign right breast calcifications. EXAM: DIGITAL DIAGNOSTIC RIGHT MAMMOGRAM WITH CAD COMPARISON:  Previous exam(s). ACR Breast Density Category b: There are scattered areas of fibroglandular density. FINDINGS: No suspicious masses or calcifications are seen in either breast. Spot compression magnification views were performed over the central to slightly lower right breast demonstrating continued evolution of benign appearing rim forming calcifications. There is no mammographic evidence of malignancy in the right breast. Mammographic images were processed with CAD. IMPRESSION: 1. Benign rim forming right breast calcifications. 2.  No mammographic evidence of malignancy in the  right breast. RECOMMENDATION: Recommend annual routine screening mammography, for which the patient will be due July 2017. I have discussed the findings and recommendations with the patient. Results were also provided in writing at the conclusion of the visit. If applicable, a reminder letter will be sent to the patient regarding the next appointment. BI-RADS CATEGORY  2: Benign. Electronically Signed   By: Edwin Cap M.D.   On: 03/29/2015 08:23    Discharge Exam:  Filed Vitals:   04/03/15 0121 04/03/15 0526  BP: 99/56 105/51  Pulse: 75 67  Temp: 98.6 F (37 C) 98.2 F (36.8 C)  Resp: 17 17    General: WN WF who is alert. Lungs: Clear to auscultation and symmetric breath sounds. Heart:  RRR. No murmur or rub. Abdomen: Soft. No mass.  No hernia. Normal bowel sounds. Incisions look good.  Discharge Medications:     Medication List    TAKE these medications        atorvastatin 40 MG tablet  Commonly known as:  LIPITOR  Take 40 mg by mouth daily.     carboxymethylcellulose 0.5 % Soln  Commonly known as:  REFRESH  PLUS  Place 1 drop into both eyes 3 (three) times daily as needed.     HYDROcodone-acetaminophen 5-325 MG tablet  Commonly known as:  NORCO/VICODIN  Take 1-2 tablets by mouth every 6 (six) hours as needed for moderate pain.     omeprazole 40 MG capsule  Commonly known as:  PRILOSEC  Take 40 mg by mouth daily.     ondansetron 4 MG disintegrating tablet  Commonly known as:  ZOFRAN-ODT  Take 4 mg by mouth every 8 (eight) hours as needed for nausea or vomiting.        Disposition: 01-Home or Self Care      Discharge Instructions    Diet - low sodium heart healthy    Complete by:  As directed      Increase activity slowly    Complete by:  As directed             Activity:  Lifting - No lifting more than 15 pounds for 1 week, then no limit  Wound Care:   May shower starting tomorrow  Diet:  As tolerated  Follow up appointment:  Call Dr. Allene PyoNewman's  office [facility]) at [phone] for an appointment in 2 to 4 weeks.  Medications and dosages:  Resume your home medications.  You have a prescription for:  Vicodin   Signed: Ovidio Kinavid Karyl Sharrar, M.D., [facility]FACS [facility] Office:  [phone]  04/03/2015, 7:21 AM

## 2015-12-05 ENCOUNTER — Other Ambulatory Visit: Payer: Self-pay | Admitting: Nurse Practitioner

## 2015-12-05 DIAGNOSIS — Z1231 Encounter for screening mammogram for malignant neoplasm of breast: Secondary | ICD-10-CM

## 2015-12-11 ENCOUNTER — Ambulatory Visit: Payer: Medicaid Other

## 2016-01-03 ENCOUNTER — Ambulatory Visit
Admission: RE | Admit: 2016-01-03 | Discharge: 2016-01-03 | Disposition: A | Discharge status: Home / Self Care | Payer: Medicaid Other | Source: Ambulatory Visit | Attending: Nurse Practitioner | Admitting: Nurse Practitioner

## 2016-01-03 DIAGNOSIS — Z1231 Encounter for screening mammogram for malignant neoplasm of breast: Secondary | ICD-10-CM

## 2016-02-20 ENCOUNTER — Emergency Department (HOSPITAL_BASED_OUTPATIENT_CLINIC_OR_DEPARTMENT_OTHER)
Admission: EM | Admit: 2016-02-20 | Discharge: 2016-02-20 | Disposition: A | Discharge status: Home / Self Care | Payer: Medicaid Other | Attending: Emergency Medicine | Admitting: Emergency Medicine

## 2016-02-20 ENCOUNTER — Emergency Department (HOSPITAL_BASED_OUTPATIENT_CLINIC_OR_DEPARTMENT_OTHER): Payer: Medicaid Other

## 2016-02-20 ENCOUNTER — Encounter (HOSPITAL_BASED_OUTPATIENT_CLINIC_OR_DEPARTMENT_OTHER): Payer: Self-pay

## 2016-02-20 DIAGNOSIS — Y92007 Garden or yard of unspecified non-institutional (private) residence as the place of occurrence of the external cause: Secondary | ICD-10-CM | POA: Diagnosis not present

## 2016-02-20 DIAGNOSIS — W172XXA Fall into hole, initial encounter: Secondary | ICD-10-CM | POA: Diagnosis not present

## 2016-02-20 DIAGNOSIS — S8992XA Unspecified injury of left lower leg, initial encounter: Secondary | ICD-10-CM | POA: Insufficient documentation

## 2016-02-20 DIAGNOSIS — Z91040 Latex allergy status: Secondary | ICD-10-CM | POA: Insufficient documentation

## 2016-02-20 DIAGNOSIS — Y9301 Activity, walking, marching and hiking: Secondary | ICD-10-CM | POA: Insufficient documentation

## 2016-02-20 DIAGNOSIS — Y999 Unspecified external cause status: Secondary | ICD-10-CM | POA: Insufficient documentation

## 2016-02-20 MED ORDER — TRAMADOL HCL 50 MG PO TABS: 50.0000 mg | ORAL_TABLET | Freq: Four times a day (QID) | ORAL | 0 refills | Status: DC | PRN

## 2016-02-20 MED FILL — traMADol HCL 50 MG TABS: 50 | 5 days supply | Qty: 15 | Fill #0

## 2016-02-20 NOTE — ED Provider Notes (Signed)
[facility] Provider Note   CSN: [id number] Arrival date & time: 02/20/16  1346     History   Chief Complaint Chief Complaint  Patient presents with  . Knee Injury    HPI Paula Owens is a 60 y.o. female.  HPI   60 year old female who is blind in the left eye with history of arthritis presenting with complaints of left knee injury. Patient states that earlier today she was walking in the yard, accidentally stepped in a hole and twisted her left knee. She denies falling off hitting her head. She endorsed sharp achy 6 out of 10 pain to the lateral aspect of the knee worsening with ambulation. She is able to ambulate. She denies any significant back pain, hip pain or ankle pain. No specific treatment tried prior to arrival. She denies any precipitating symptoms prior to the injury.  Past Medical History:  Diagnosis Date  . Arthritis   . Blind left eye    legally blind left eye,   right eye diminishing sight   AUTO IMMUNE DISEASE  . Fatty liver disease, nonalcoholic   . GERD (gastroesophageal reflux disease)   . Mass of kidney    just watching     Patient Active Problem List   Diagnosis Date Noted  . Gall bladder disease 04/02/2015    Past Surgical History:  Procedure Laterality Date  . CHOLECYSTECTOMY  04/02/2015   LAPROSCOPIC  . CHOLECYSTECTOMY N/A 04/02/2015   Procedure: LAPAROSCOPIC CHOLECYSTECTOMY WITH INTRAOPERATIVE CHOLANGIOGRAM;  Surgeon: Ovidio Kinavid Newman, MD;  [facility];  Service: General;  Laterality: N/A;  . TONSILLECTOMY    . TUBAL LIGATION      OB History    No data available       Home Medications    Prior to Admission medications   Not on File    Family History No family history on file.  Social History Social History  Substance Use Topics  . Smoking status: Never Smoker  . Smokeless tobacco: Never Used  . Alcohol use No     Allergies   Erythromycin; Latex; Sulfur; Vitamin d analogs; Doxycycline; and  Penicillins   Review of Systems Review of Systems  Constitutional: Negative for fever.  Musculoskeletal: Positive for arthralgias.  Skin: Negative for rash and wound.  Neurological: Negative for numbness.     Physical Exam Updated Vital Signs BP 116/60 (BP Location: Right Arm)   Pulse 80   Temp 98 F (36.7 C) (Oral)   Resp 18   Ht 5\' 1"  (1.549 m)   Wt 80.7 kg   SpO2 98%   BMI 33.63 kg/m   Physical Exam  Constitutional: She appears well-developed and well-nourished. No distress.  HENT:  Head: Atraumatic.  Eyes: Conjunctivae are normal.  Neck: Neck supple.  Cardiovascular: Intact distal pulses.   Musculoskeletal: She exhibits tenderness (Left knee: Tenderness to lateral joint line on palpation. Decreased knee flexion extension secondary to pain. No joint laxity, no significant swelling or bruising noted and no gross deformity.).  No midline spine tenderness crepitus or step-off. Left hip and left ankle with full range of motion and nontender.  Neurological: She is alert.  Skin: No rash noted.  Psychiatric: She has a normal mood and affect.  Nursing note and vitals reviewed.    ED Treatments / Results  Labs (all labs ordered are listed, but only abnormal results are displayed) Labs Reviewed - No data to display  EKG  EKG Interpretation None  Radiology Dg Knee Complete 4 Views Left  Result Date: 02/20/2016 CLINICAL DATA:  False morning with left knee pain, initial encounter EXAM: LEFT KNEE - COMPLETE 4+ VIEW COMPARISON:  None. FINDINGS: No evidence of fracture, dislocation, or joint effusion. No evidence of arthropathy or other focal bone abnormality. Soft tissues are unremarkable. IMPRESSION: No acute abnormality noted. Electronically Signed   By: Alcide CleverMark  Lukens M.D.   On: 02/20/2016 14:17    Procedures Procedures (including critical care time)  Medications Ordered in ED Medications - No data to display   Initial Impression / Assessment and Plan / ED  Course  I have reviewed the triage vital signs and the nursing notes.  Pertinent labs & imaging results that were available during my care of the patient were reviewed by me and considered in my medical decision making (see chart for details).  Clinical Course    BP 116/60 (BP Location: Right Arm)   Pulse 80   Temp 98 F (36.7 C) (Oral)   Resp 18   Ht 5\' 1"  (1.549 m)   Wt 80.7 kg   SpO2 98%   BMI 33.63 kg/m    Final Clinical Impressions(s) / ED Diagnoses   Final diagnoses:  Injury of left knee, initial encounter    New Prescriptions New Prescriptions   No medications on file   3:43 PM Patient injured left knee. X-ray negative for acute fractures or dislocation. This is likely a sprain. Knee sleeve provided for support. Rice therapy discussed. Orthopedic referral given as needed. Patient requests for [employer].     Fayrene HelperBowie Chauncey Sciulli, PA-C 02/20/16 1546    Gwyneth SproutWhitney Plunkett, MD 02/20/16 [id number]

## 2016-02-20 NOTE — ED Triage Notes (Signed)
Pt states she fell this am-pain to left knee-presents to triage in w/c-NAD

## 2016-02-20 NOTE — ED Notes (Signed)
Pt given d/c instructions as per chart. Verbalizes understanding. No questions. Rx x 1 

## 2016-02-20 NOTE — ED Notes (Addendum)
Pt states she stepped back into a hole and injured her left knee. Moves slightly. Feels touch. Cap refill < 3 sec. +dpp palp. Ice applied to knee.

## 2016-03-05 ENCOUNTER — Encounter: Payer: Self-pay | Admitting: Physical Therapy

## 2016-03-05 ENCOUNTER — Ambulatory Visit: Payer: Medicaid Other | Attending: Orthopedic Surgery | Admitting: Physical Therapy

## 2016-03-05 DIAGNOSIS — R262 Difficulty in walking, not elsewhere classified: Secondary | ICD-10-CM | POA: Insufficient documentation

## 2016-03-05 DIAGNOSIS — M25562 Pain in left knee: Secondary | ICD-10-CM

## 2016-03-05 NOTE — Therapy (Signed)
[facility] [address] [place], [place], [address] Phone: [phone]   Fax:  [phone]  Physical Therapy Evaluation  Patient Details  Name: Paula Owens MRN: [id number] Date of Birth: 09/14/1955 Referring Provider: Madelon Lipsaffrey, MD  Encounter Date: 03/05/2016      PT End of Session - 03/05/16 0810    Visit Number 1   Authorization Type medicaid evaluation, one-time visit   PT Start Time 0801   PT Stop Time 0855   PT Time Calculation (min) 54 min   Activity Tolerance Patient tolerated treatment well;Patient limited by pain   Behavior During Therapy [facility]WFL for tasks assessed/performed      Past Medical History:  Diagnosis Date  . Arthritis   . Blind left eye    legally blind left eye,   right eye diminishing sight   AUTO IMMUNE DISEASE  . Fatty liver disease, nonalcoholic   . GERD (gastroesophageal reflux disease)   . Mass of kidney    just watching     Past Surgical History:  Procedure Laterality Date  . CHOLECYSTECTOMY  04/02/2015   LAPROSCOPIC  . CHOLECYSTECTOMY N/A 04/02/2015   Procedure: LAPAROSCOPIC CHOLECYSTECTOMY WITH INTRAOPERATIVE CHOLANGIOGRAM;  Surgeon: Ovidio Kinavid Newman, MD;  [facility];  Service: General;  Laterality: N/A;  . TONSILLECTOMY    . TUBAL LIGATION      There were no vitals filed for this visit.       Subjective Assessment - 03/05/16 0810    Subjective Was in the yard and stepped back into a hole about 2.5 ft deep. Has a large bruise on back, went to ER. Denies frequent falling, has impaired vision.    Diagnostic tests MRI: complete L ACL tear, patellofemoral chondromalacia, femoral condyle and postmed tibial plateau osseous contusion, marrow edema fibular head.   Currently in Pain? Yes   Pain Score 3    Pain Location Knee   Pain Orientation Left   Pain Descriptors / Indicators Dull;Sharp   Pain Radiating Towards foot feels swollen   Aggravating Factors  turning the wrong way,  bumping it            [facility] - 03/05/16 0001      Assessment   Medical Diagnosis L knee ACL tear with bone contusion   Referring Provider Madelon Lipsaffrey, MD   Hand Dominance Right   Next MD Visit 03/25/2016   Prior Therapy no     Home Environment   Living Environment Private residence   Living Arrangements Spouse/significant other;Children   Additional Comments steps to enter & steps to basement     ROM / Strength   AROM / PROM / Strength PROM;Strength     PROM   PROM Assessment Site Knee   Right/Left Knee Left   Left Knee Extension 0   Left Knee Flexion 90  soft end feel     Strength   Strength Assessment Site Knee   Right/Left Knee Left   Left Knee Flexion 5/5   Left Knee Extension 5/5     Palpation   Patella mobility WFL   Palpation comment TTP fibular head, ant/med knee                   [facility] Adult PT Treatment/Exercise - 03/05/16 0001      Exercises   Exercises Knee/Hip     Knee/Hip Exercises: Stretches   Passive Hamstring Stretch Limitations seated EOB     Knee/Hip Exercises: Standing   Gait Training  heel-toe, stairs/steps     Knee/Hip Exercises: Supine   Heel Slides Limitations to tolerace, bend R knee for cue   Straight Leg Raises Limitations cues for quad set     Knee/Hip Exercises: Sidelying   Clams L leg only performed today.      Modalities   Modalities Cryotherapy     Cryotherapy   Number Minutes Cryotherapy 10 Minutes   Cryotherapy Location Knee   Type of Cryotherapy Ice pack                PT Education - 03/05/16 1032    Education provided Yes   Education Details anatomy of condition, POC, HEP, exercise form/rationale, gait and stairs, assistive devices   Person(s) Educated Patient   Methods Explanation;Demonstration;Tactile cues;Verbal cues;Handout   Comprehension Verbalized understanding;Returned demonstration;Verbal cues required;Tactile cues required                    Plan -  03/05/16 0929    Clinical Impression Statement Pt presents to PT with complaints of L knee pain following an accidental step into hole in yard. Pain at end range knee flexion, soft end feel and TTP at fibular head and ant-med knee. Minimal weakness noted but pt did have discomfort with MMT. Reported vision impairments were discussed for safety with walking and suggested using treking poles rather than a single point cane for safety. Pt was provided with HEP and instructed in appropraite gait pattern, she was able to demo with proper form and verbalized comfort and understanding. Was instructed to contact us with any furhter questions.    PT Treatment/Interventions ADLs/Self Care Home Management;Therapeutic exercise;Cryotherapy;Patient/family education;Gait training   Consulted and Agree with Plan of Care Patient      Patient will benefit from skilled therapeutic intervention in order to improve the following deficits and impairments:  Abnormal gait, Pain, Decreased strength, Decreased activity tolerance, Decreased range of motion  Visit Diagnosis: Acute pain of left knee - Plan: PT plan of care cert/re-cert  Difficulty in walking, not elsewhere classified - Plan: PT plan of care cert/re-cert     Problem List Patient Active Problem List   Diagnosis Date Noted  . Gall bladder disease 04/02/2015    Gwenlyn FoundJessica C. Lovelee Forner PT, DPT 03/05/16 10:35 AM   [facility] [address] [place], [place], [address] Phone: [phone]   Fax:  [phone]  Name: Paula Owens MRN: [id number] Date of Birth: Sep 26, 1955

## 2016-12-12 ENCOUNTER — Other Ambulatory Visit: Payer: Self-pay | Admitting: Nurse Practitioner

## 2016-12-12 DIAGNOSIS — Z1231 Encounter for screening mammogram for malignant neoplasm of breast: Secondary | ICD-10-CM

## 2017-01-07 ENCOUNTER — Ambulatory Visit
Admission: RE | Admit: 2017-01-07 | Discharge: 2017-01-07 | Disposition: A | Discharge status: Home / Self Care | Payer: Medicaid Other | Source: Ambulatory Visit | Attending: Nurse Practitioner | Admitting: Nurse Practitioner

## 2017-01-07 DIAGNOSIS — Z1231 Encounter for screening mammogram for malignant neoplasm of breast: Secondary | ICD-10-CM

## 2018-01-19 ENCOUNTER — Other Ambulatory Visit: Payer: Self-pay | Admitting: Nurse Practitioner

## 2018-01-19 DIAGNOSIS — Z1231 Encounter for screening mammogram for malignant neoplasm of breast: Secondary | ICD-10-CM

## 2018-03-05 ENCOUNTER — Ambulatory Visit
Admission: RE | Admit: 2018-03-05 | Discharge: 2018-03-05 | Disposition: A | Discharge status: Home / Self Care | Payer: Medicaid Other | Source: Ambulatory Visit | Attending: Nurse Practitioner | Admitting: Nurse Practitioner

## 2018-03-05 DIAGNOSIS — Z1231 Encounter for screening mammogram for malignant neoplasm of breast: Secondary | ICD-10-CM

## 2019-02-08 ENCOUNTER — Other Ambulatory Visit: Payer: Self-pay | Admitting: Nurse Practitioner

## 2019-02-08 DIAGNOSIS — Z1231 Encounter for screening mammogram for malignant neoplasm of breast: Secondary | ICD-10-CM

## 2019-03-30 ENCOUNTER — Other Ambulatory Visit: Payer: Self-pay

## 2019-03-30 ENCOUNTER — Ambulatory Visit
Admission: RE | Admit: 2019-03-30 | Discharge: 2019-03-30 | Disposition: A | Discharge status: Home / Self Care | Payer: Medicaid Other | Source: Ambulatory Visit | Attending: Nurse Practitioner | Admitting: Nurse Practitioner

## 2019-03-30 DIAGNOSIS — Z1231 Encounter for screening mammogram for malignant neoplasm of breast: Secondary | ICD-10-CM

## 2020-08-28 ENCOUNTER — Other Ambulatory Visit: Payer: Self-pay | Admitting: Nurse Practitioner

## 2020-08-28 DIAGNOSIS — Z1231 Encounter for screening mammogram for malignant neoplasm of breast: Secondary | ICD-10-CM

## 2020-10-24 ENCOUNTER — Other Ambulatory Visit: Payer: Self-pay

## 2020-10-24 ENCOUNTER — Ambulatory Visit
Admission: RE | Admit: 2020-10-24 | Discharge: 2020-10-24 | Disposition: A | Discharge status: Home / Self Care | Payer: Medicare Other | Source: Ambulatory Visit | Attending: Nurse Practitioner | Admitting: Nurse Practitioner

## 2020-10-24 DIAGNOSIS — Z1231 Encounter for screening mammogram for malignant neoplasm of breast: Secondary | ICD-10-CM

## 2020-11-27 ENCOUNTER — Other Ambulatory Visit: Payer: Self-pay | Admitting: Nurse Practitioner

## 2020-11-27 DIAGNOSIS — Z780 Asymptomatic menopausal state: Secondary | ICD-10-CM

## 2020-12-03 ENCOUNTER — Other Ambulatory Visit: Payer: Self-pay

## 2020-12-03 ENCOUNTER — Ambulatory Visit (INDEPENDENT_AMBULATORY_CARE_PROVIDER_SITE_OTHER): Payer: Medicare Other | Admitting: Podiatry

## 2020-12-03 ENCOUNTER — Encounter: Payer: Self-pay | Admitting: Podiatry

## 2020-12-03 DIAGNOSIS — B351 Tinea unguium: Secondary | ICD-10-CM

## 2020-12-03 DIAGNOSIS — L600 Ingrowing nail: Secondary | ICD-10-CM | POA: Diagnosis not present

## 2020-12-03 NOTE — Progress Notes (Signed)
Subjective:   Patient ID: Paula Owens, female   DOB: 65 y.o.   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  Patient does not remember any drainage or bleeding but states its been ongoing and making it increasingly difficult to wear shoe gear comfortably.  Patient does not smoke likes to be active   Review of Systems  All other systems reviewed and are negative.      Objective:  Physical Exam Vitals and nursing note reviewed.  Constitutional:      Appearance: She is well-developed.  Pulmonary:     Effort: Pulmonary effort is normal.  Musculoskeletal:        General: Normal range of motion.  Skin:    General: Skin is warm.  Neurological:     Mental Status: She is alert.    Neurovascular status intact muscle strength found to be adequate range of motion adequate with a thickened deformed right hallux nail bed incurvated in the corner store when pressed making shoe gear difficult.  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  Due to the chronic discomfort and pain she is opted for permanent procedure and I recommended permanent removal of the nail and unfortunately the entire nail needs to be removed versus partial.  I allowed her to read consent form going over alternative treatments complications she wants surgery signed consent form and today I infiltrated the right hallux 60 mg like Marcaine mixture sterile prep done and using sterile instrumentation remove the hallux nail exposed matrix applied phenol for applications 30 seconds followed by alcohol lavage sterile dressing gave  instructions on soaks and reappoint

## 2020-12-03 NOTE — Patient Instructions (Signed)

## 2020-12-13 ENCOUNTER — Other Ambulatory Visit: Payer: Self-pay | Admitting: Nurse Practitioner

## 2020-12-13 DIAGNOSIS — Z780 Asymptomatic menopausal state: Secondary | ICD-10-CM

## 2020-12-18 ENCOUNTER — Ambulatory Visit
Admission: RE | Admit: 2020-12-18 | Discharge: 2020-12-18 | Disposition: A | Discharge status: Home / Self Care | Payer: Medicare Other | Source: Ambulatory Visit | Attending: Nurse Practitioner | Admitting: Nurse Practitioner

## 2020-12-18 ENCOUNTER — Other Ambulatory Visit: Payer: Medicare Other

## 2020-12-18 ENCOUNTER — Other Ambulatory Visit: Payer: Self-pay

## 2020-12-18 DIAGNOSIS — Z780 Asymptomatic menopausal state: Secondary | ICD-10-CM

## 2021-12-26 ENCOUNTER — Other Ambulatory Visit: Payer: Self-pay | Admitting: Nurse Practitioner

## 2021-12-26 DIAGNOSIS — Z1231 Encounter for screening mammogram for malignant neoplasm of breast: Secondary | ICD-10-CM

## 2022-01-16 ENCOUNTER — Ambulatory Visit: Payer: Medicare Other

## 2022-02-20 ENCOUNTER — Ambulatory Visit
Admission: RE | Admit: 2022-02-20 | Discharge: 2022-02-20 | Disposition: A | Discharge status: Home / Self Care | Payer: Medicare Other | Source: Ambulatory Visit | Attending: Nurse Practitioner | Admitting: Nurse Practitioner

## 2022-02-20 DIAGNOSIS — Z1231 Encounter for screening mammogram for malignant neoplasm of breast: Secondary | ICD-10-CM

## 2022-08-29 IMAGING — MG MM DIGITAL SCREENING BILAT W/ TOMO AND CAD
8 series · 8 of 24 positions shown · non-contrast
Comparison: Previous exam(s).

CLINICAL DATA: Screening.

EXAM:
DIGITAL SCREENING BILATERAL MAMMOGRAM WITH TOMOSYNTHESIS AND CAD
TECHNIQUE: Bilateral screening digital craniocaudal and mediolateral oblique
mammograms were obtained. Bilateral screening digital breast
tomosynthesis was performed. The images were evaluated with
computer-aided detection.

[L MLO synth-2D]
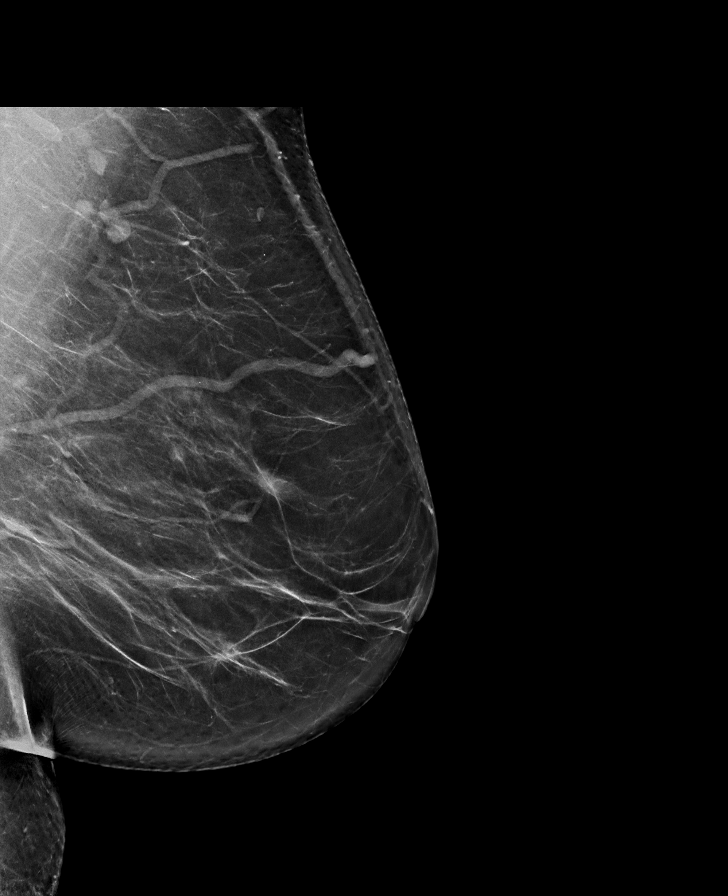

[L CC synth-2D]
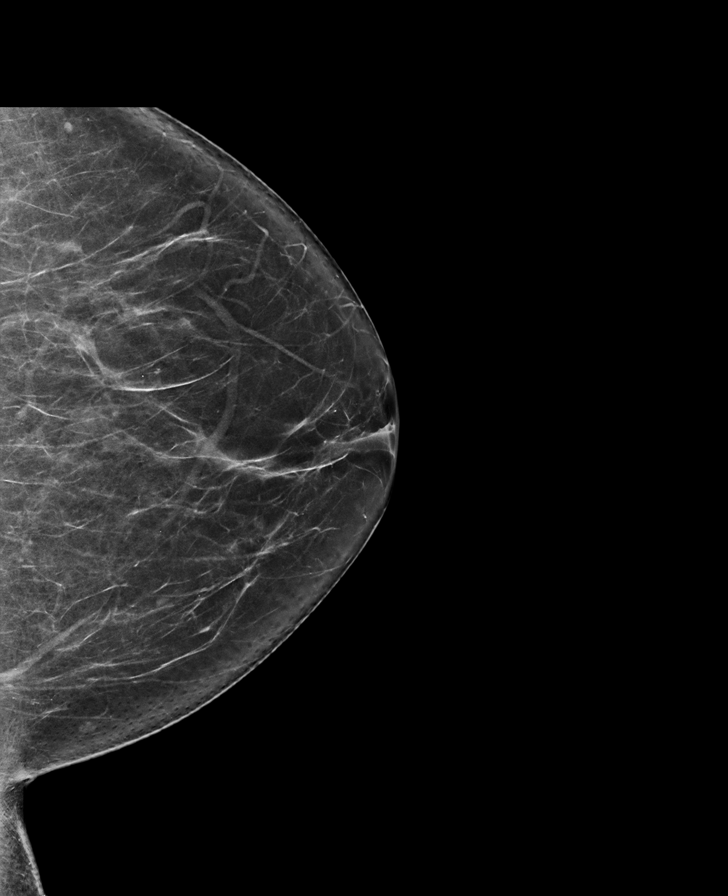

[R MLO synth-2D]
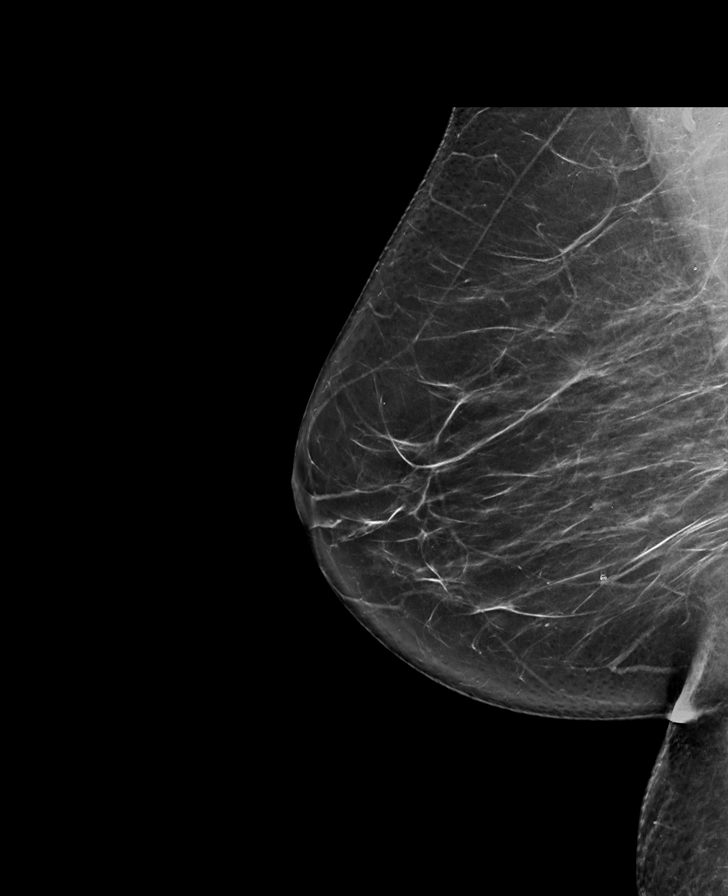

[R CC synth-2D]
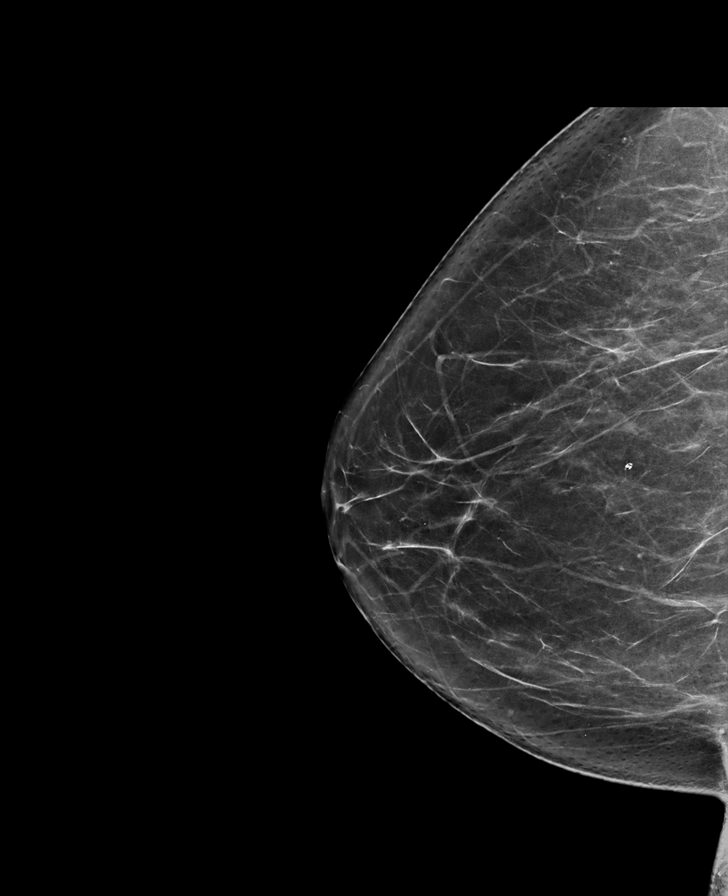

[R CC tomo · tomo slice 39/76.0]
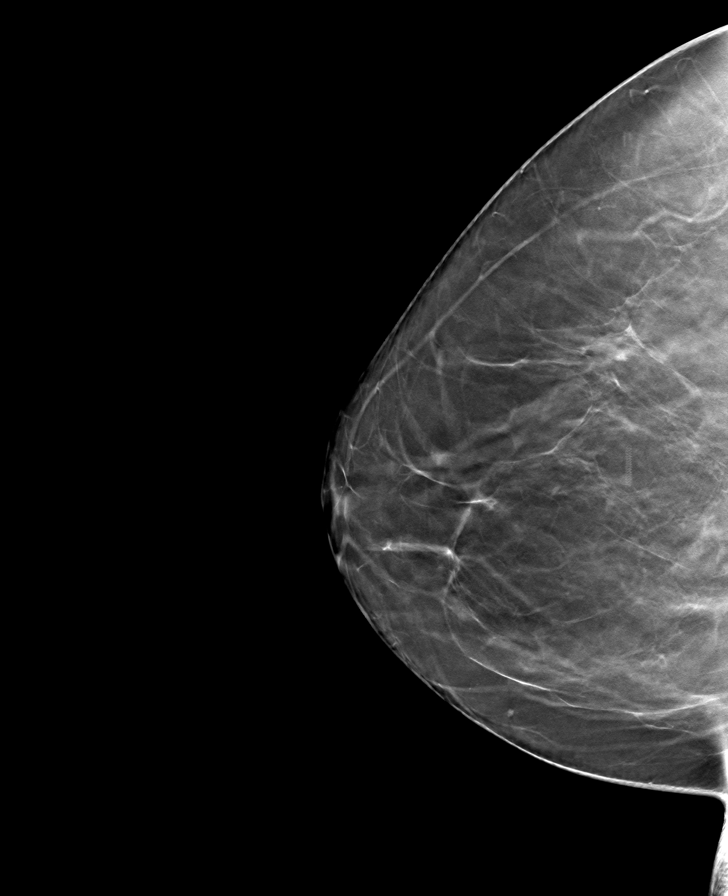

[L CC tomo · tomo slice 37/73.0]
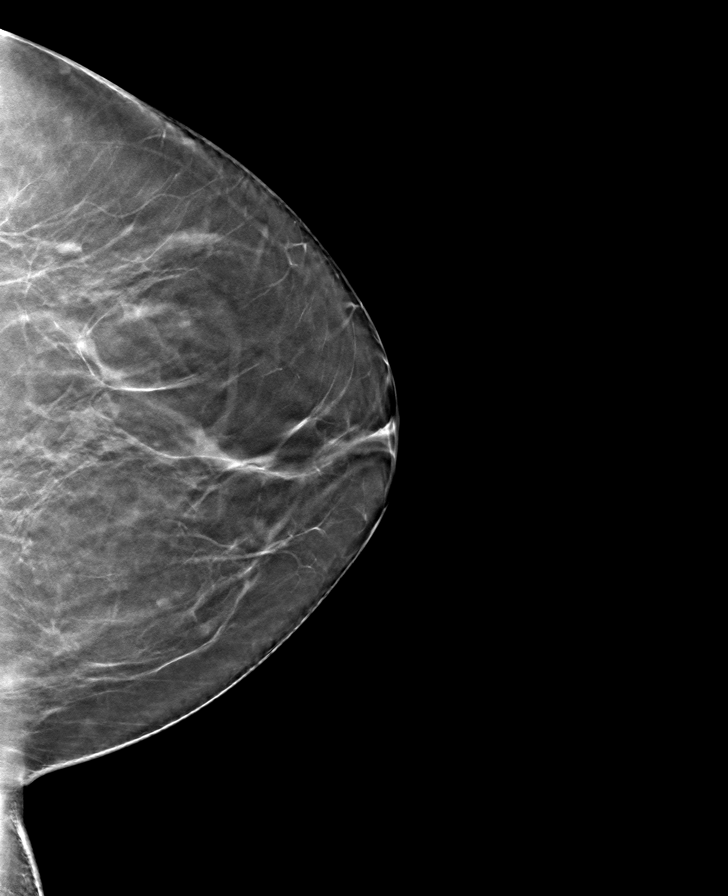

[L MLO tomo · tomo slice 42/83.0]
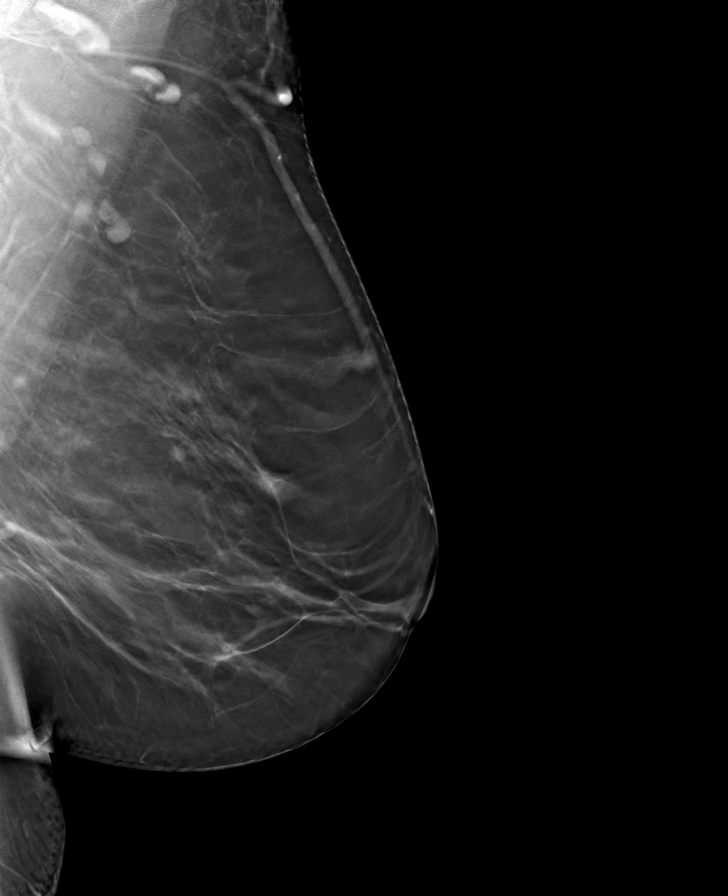

[R MLO tomo · tomo slice 39/77.0]
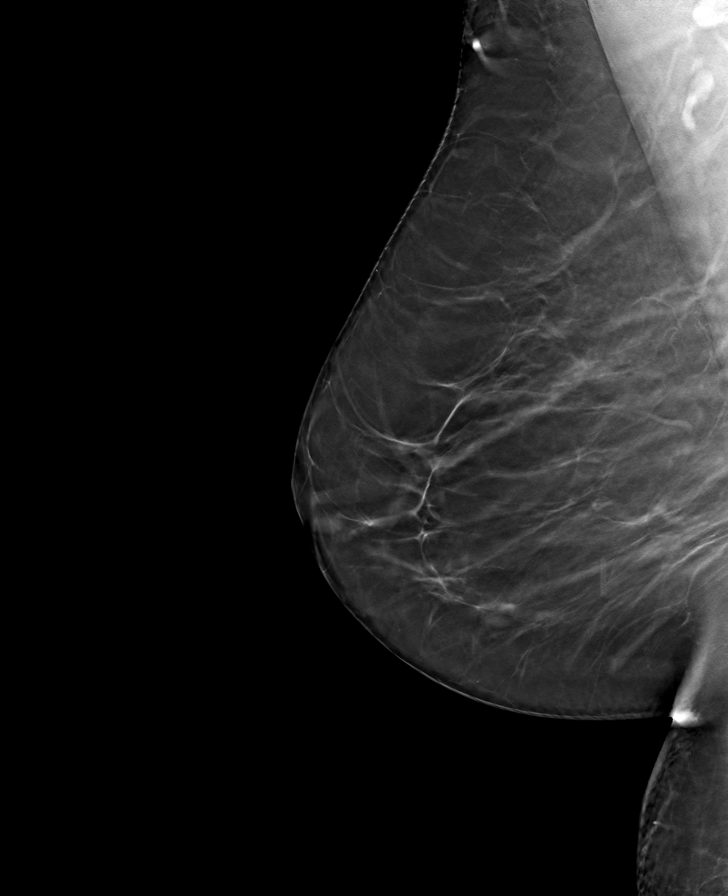

[8 of 24 positions shown; findings below may reference images not displayed]

ACR Breast Density Category b: There are scattered areas of
fibroglandular density.
FINDINGS: There are no findings suspicious for malignancy.
IMPRESSION: No mammographic evidence of malignancy. A result letter of this
screening mammogram will be mailed directly to the patient.

RECOMMENDATION:
Screening mammogram in one year. (Code:51-O-LD2)

BI-RADS CATEGORY  1: Negative.
